# Patient Record
Sex: Female | Born: 1970
Health system: Southern US, Community
[De-identification: ages and names within clinical notes are randomized; demographics above are authoritative.]

## PROBLEM LIST (undated history)

## (undated) DIAGNOSIS — D429 Neoplasm of uncertain behavior of meninges, unspecified: Secondary | ICD-10-CM

## (undated) DIAGNOSIS — D332 Benign neoplasm of brain, unspecified: Secondary | ICD-10-CM

## (undated) DIAGNOSIS — IMO0001 Reserved for inherently not codable concepts without codable children: Secondary | ICD-10-CM

## (undated) DIAGNOSIS — E785 Hyperlipidemia, unspecified: Secondary | ICD-10-CM

## (undated) DIAGNOSIS — R42 Dizziness and giddiness: Secondary | ICD-10-CM

## (undated) DIAGNOSIS — IMO0002 Reserved for concepts with insufficient information to code with codable children: Secondary | ICD-10-CM

## (undated) HISTORY — DX: Hyperlipidemia, unspecified: E78.5

## (undated) HISTORY — PX: HALO APPLICATION: SHX1720

## (undated) HISTORY — DX: Neoplasm of uncertain behavior of meninges, unspecified: D42.9

## (undated) HISTORY — PX: MYRINGOTOMY WITH TUBE PLACEMENT: SHX5663

---

## 2000-08-27 ENCOUNTER — Other Ambulatory Visit: Admission: RE | Admit: 2000-08-27 | Discharge: 2000-08-27 | Payer: Self-pay

## 2001-05-13 ENCOUNTER — Other Ambulatory Visit: Admission: RE | Admit: 2001-05-13 | Discharge: 2001-05-13 | Payer: Self-pay

## 2004-03-29 ENCOUNTER — Ambulatory Visit (HOSPITAL_COMMUNITY): Admission: RE | Admit: 2004-03-29 | Discharge: 2004-03-29 | Payer: Self-pay | Admitting: Family Medicine

## 2005-03-26 ENCOUNTER — Ambulatory Visit: Payer: Self-pay | Admitting: Cardiology

## 2005-04-02 ENCOUNTER — Ambulatory Visit: Payer: Self-pay | Admitting: Cardiology

## 2009-12-05 HISTORY — PX: BRAIN BIOPSY: SHX905

## 2011-02-26 ENCOUNTER — Other Ambulatory Visit (HOSPITAL_COMMUNITY): Payer: Self-pay | Admitting: Family Medicine

## 2011-02-26 DIAGNOSIS — Z139 Encounter for screening, unspecified: Secondary | ICD-10-CM

## 2011-03-01 ENCOUNTER — Ambulatory Visit (HOSPITAL_COMMUNITY)
Admission: RE | Admit: 2011-03-01 | Discharge: 2011-03-01 | Disposition: A | Discharge status: Home / Self Care | Payer: Self-pay | Source: Ambulatory Visit | Attending: Family Medicine | Admitting: Family Medicine

## 2011-03-01 DIAGNOSIS — Z139 Encounter for screening, unspecified: Secondary | ICD-10-CM

## 2011-08-06 DIAGNOSIS — D329 Benign neoplasm of meninges, unspecified: Secondary | ICD-10-CM | POA: Insufficient documentation

## 2011-09-06 ENCOUNTER — Ambulatory Visit (INDEPENDENT_AMBULATORY_CARE_PROVIDER_SITE_OTHER): Payer: Medicaid Other | Admitting: Otolaryngology

## 2011-09-06 DIAGNOSIS — H902 Conductive hearing loss, unspecified: Secondary | ICD-10-CM

## 2011-09-06 DIAGNOSIS — C313 Malignant neoplasm of sphenoid sinus: Secondary | ICD-10-CM

## 2011-09-06 DIAGNOSIS — H6120 Impacted cerumen, unspecified ear: Secondary | ICD-10-CM

## 2011-09-06 DIAGNOSIS — H6980 Other specified disorders of Eustachian tube, unspecified ear: Secondary | ICD-10-CM

## 2011-09-13 ENCOUNTER — Ambulatory Visit (INDEPENDENT_AMBULATORY_CARE_PROVIDER_SITE_OTHER): Payer: Medicaid Other | Admitting: Otolaryngology

## 2011-09-13 DIAGNOSIS — H902 Conductive hearing loss, unspecified: Secondary | ICD-10-CM

## 2011-09-13 DIAGNOSIS — H6980 Other specified disorders of Eustachian tube, unspecified ear: Secondary | ICD-10-CM

## 2011-09-13 DIAGNOSIS — H6520 Chronic serous otitis media, unspecified ear: Secondary | ICD-10-CM

## 2011-10-11 ENCOUNTER — Ambulatory Visit (INDEPENDENT_AMBULATORY_CARE_PROVIDER_SITE_OTHER): Payer: Medicaid Other | Admitting: Otolaryngology

## 2011-10-11 DIAGNOSIS — H7200 Central perforation of tympanic membrane, unspecified ear: Secondary | ICD-10-CM

## 2011-10-11 DIAGNOSIS — H6980 Other specified disorders of Eustachian tube, unspecified ear: Secondary | ICD-10-CM

## 2011-10-11 DIAGNOSIS — H6990 Unspecified Eustachian tube disorder, unspecified ear: Secondary | ICD-10-CM

## 2012-04-10 ENCOUNTER — Ambulatory Visit (INDEPENDENT_AMBULATORY_CARE_PROVIDER_SITE_OTHER): Payer: Medicaid Other | Admitting: Otolaryngology

## 2012-04-10 DIAGNOSIS — H6980 Other specified disorders of Eustachian tube, unspecified ear: Secondary | ICD-10-CM

## 2012-04-10 DIAGNOSIS — H7200 Central perforation of tympanic membrane, unspecified ear: Secondary | ICD-10-CM

## 2012-05-19 ENCOUNTER — Other Ambulatory Visit (HOSPITAL_COMMUNITY): Payer: Self-pay | Admitting: Physician Assistant

## 2012-05-19 DIAGNOSIS — Z139 Encounter for screening, unspecified: Secondary | ICD-10-CM

## 2012-06-02 ENCOUNTER — Ambulatory Visit (HOSPITAL_COMMUNITY)
Admission: RE | Admit: 2012-06-02 | Discharge: 2012-06-02 | Disposition: A | Discharge status: Home / Self Care | Payer: Medicare Other | Source: Ambulatory Visit | Attending: Physician Assistant | Admitting: Physician Assistant

## 2012-06-02 DIAGNOSIS — Z1231 Encounter for screening mammogram for malignant neoplasm of breast: Secondary | ICD-10-CM | POA: Insufficient documentation

## 2012-06-02 DIAGNOSIS — Z139 Encounter for screening, unspecified: Secondary | ICD-10-CM

## 2012-07-08 NOTE — H&P (Signed)
  NTS SOAP Note  Vital Signs:  Vitals as of: 07/08/2012: Systolic 108: Diastolic 64: Heart Rate 85: Temp 98.22F: Height 36ft 6in: Weight 143Lbs 0 Ounces: Pain Level 7: BMI 23.08  BMI : 23.08 kg/m2  Subjective: This 42 Years 42 Months old Female presents for a tender nodule on her elbow.  Has been present for some time, but is increasing in size and tender.   Review of Symptoms:  Constitutional:  fatigue    headache Eyes:  blurred vision bilateral Cardiovascular:  unremarkable   Respiratory:  dyspnea,cough Gastrointestin    nausea,heartburn Genitourinary:unremarkable       joint and back pain Hematolgic/Lymphatic:unremarkable     Allergic/Immunologic:unremarkable     Past Medical History:    Reviewed   Past Medical History  Surgical History: brain biopsy Allergies: nkda Medications: hydrocodone, meloxicam   Social History:Reviewed  Social History  Preferred Language: English Race:  Black or African American Ethnicity: Not Hispanic / Latino Age: 42 Years 0 Months Marital Status:  S Alcohol:  No Recreational drug(s):  No   Smoking Status: Never smoker reviewed on 07/08/2012 Functional Status reviewed on mm/dd/yyyy ------------------------------------------------ Bathing: Normal Cooking: Normal Dressing: Normal Driving: Normal Eating: Normal Managing Meds: Normal Oral Care: Normal Shopping: Normal Toileting: Normal Transferring: Normal Walking: Normal Cognitive Status reviewed on mm/dd/yyyy ------------------------------------------------ Attention: Normal Decision Making: Normal Language: Normal Memory: Normal Motor: Normal Perception: Normal Problem Solving: Normal Visual and Spatial: Normal   Family History:  Reviewed  Family Health History Family History is Unknown    Objective Information: General:  Well appearing, well nourished in no distress. Heart:  RRR, no murmur Lungs:    CTA  bilaterally, no wheezes, rhonchi, rales.  Breathing unlabored.   2cm raised mobile skin nodule over elbow.  Tender to touoh.  Assessment:skin mass, left elbow  Diagnosis &amp; Procedure Smart Code   Plan:Scheduled for excision of skin neoplasm, left elbow on 07/23/12.   Patient Education:Alternative treatments to surgery were discussed with patient (and family).  Risks and benefits  of procedure were fully explained to the patient (and family) who gave informed consent. Patient/family questions were addressed.  Follow-up:Pending Surgery

## 2012-07-15 ENCOUNTER — Encounter (HOSPITAL_COMMUNITY): Payer: Self-pay | Admitting: Pharmacy Technician

## 2012-07-15 NOTE — Patient Instructions (Addendum)
    Sheena Willis  07/15/2012   Your procedure is scheduled on:  07/23/2012  Report to [facility] at  700  AM.  Call this number if you have problems the morning of surgery: [phone]   Remember:   Do not eat food or drink liquids after midnight.   Take these medicines the morning of surgery with A SIP OF WATER: none   Do not wear jewelry, make-up or nail polish.  Do not wear lotions, powders, or perfumes.   Do not shave 48 hours prior to surgery. Men may shave face and neck.  Do not bring valuables to the hospital.  [facility] is not responsible for any belongings or valuables.  Contacts, dentures or bridgework may not be worn into surgery.  Leave suitcase in the car. After surgery it may be brought to your room.  For patients admitted to the hospital, checkout time is 11:00 AM the day of discharge.   Patients discharged the day of surgery will not be allowed to drive home.  Name and phone number of your driver: family  Special Instructions: Shower using CHG 2 nights before surgery and the night before surgery.  If you shower the day of surgery use CHG.  Use special wash - you have one bottle of CHG for all showers.  You should use approximately 1/3 of the bottle for each shower.   Please read over the following fact sheets that you were given: Pain Booklet, Coughing and Deep Breathing, Surgical Site Infection Prevention, Anesthesia Post-op Instructions and Care and Recovery After Surgery PATIENT INSTRUCTIONS POST-ANESTHESIA  IMMEDIATELY FOLLOWING SURGERY:  Do not drive or operate machinery for the first twenty four hours after surgery.  Do not make any important decisions for twenty four hours after surgery or while taking narcotic pain medications or sedatives.  If you develop intractable nausea and vomiting or a severe headache please notify your doctor immediately.  FOLLOW-UP:  Please make an appointment with your surgeon as instructed. You do not need to follow up with anesthesia  unless specifically instructed to do so.  WOUND CARE INSTRUCTIONS (if applicable):  Keep a dry clean dressing on the anesthesia/puncture wound site if there is drainage.  Once the wound has quit draining you may leave it open to air.  Generally you should leave the bandage intact for twenty four hours unless there is drainage.  If the epidural site drains for more than 36-48 hours please call the anesthesia department.  QUESTIONS?:  Please feel free to call your physician or the hospital operator if you have any questions, and they will be happy to assist you.

## 2012-07-16 ENCOUNTER — Encounter (HOSPITAL_COMMUNITY)
Admission: RE | Admit: 2012-07-16 | Discharge: 2012-07-16 | Disposition: A | Discharge status: Home / Self Care | Payer: Medicare Other | Source: Ambulatory Visit | Attending: General Surgery | Admitting: General Surgery

## 2012-07-16 ENCOUNTER — Encounter (HOSPITAL_COMMUNITY): Payer: Self-pay

## 2012-07-16 HISTORY — DX: Dizziness and giddiness: R42

## 2012-07-16 HISTORY — DX: Benign neoplasm of brain, unspecified: D33.2

## 2012-07-16 HISTORY — DX: Reserved for inherently not codable concepts without codable children: IMO0001

## 2012-07-16 HISTORY — DX: Reserved for concepts with insufficient information to code with codable children: IMO0002

## 2012-07-16 LAB — CBC WITH DIFFERENTIAL/PLATELET
Basophils Relative: 0 % (ref 0–1)
HCT: 42.6 % (ref 36.0–46.0)
MCH: 29.4 pg (ref 26.0–34.0)
MCHC: 34 g/dL (ref 30.0–36.0)
Neutro Abs: 4.4 10*3/uL (ref 1.7–7.7)
Neutrophils Relative %: 62 % (ref 43–77)
RDW: 13.8 % (ref 11.5–15.5)

## 2012-07-16 LAB — BASIC METABOLIC PANEL
BUN: 12 mg/dL (ref 6–23)
Calcium: 10.1 mg/dL (ref 8.4–10.5)
Chloride: 102 mEq/L (ref 96–112)
GFR calc Af Amer: 77 mL/min — ABNORMAL LOW (ref >90)
GFR calc non Af Amer: 67 mL/min — ABNORMAL LOW (ref >90)
Potassium: 4.1 mEq/L (ref 3.5–5.1)

## 2012-07-16 MED ORDER — CHLORHEXIDINE GLUCONATE 4 % EX LIQD: 1.0000 "application " | Freq: Once | CUTANEOUS | Status: DC

## 2012-07-18 ENCOUNTER — Other Ambulatory Visit (HOSPITAL_COMMUNITY): Payer: Medicare Other

## 2012-07-23 ENCOUNTER — Encounter (HOSPITAL_COMMUNITY): Payer: Self-pay | Admitting: *Deleted

## 2012-07-23 ENCOUNTER — Ambulatory Visit (HOSPITAL_COMMUNITY): Payer: Medicare Other | Admitting: Anesthesiology

## 2012-07-23 ENCOUNTER — Encounter (HOSPITAL_COMMUNITY)
Admission: RE | Disposition: A | Discharge status: Home / Self Care | Payer: Self-pay | Source: Ambulatory Visit | Attending: General Surgery

## 2012-07-23 ENCOUNTER — Encounter (HOSPITAL_COMMUNITY): Payer: Self-pay | Admitting: Anesthesiology

## 2012-07-23 ENCOUNTER — Ambulatory Visit (HOSPITAL_COMMUNITY)
Admission: RE | Admit: 2012-07-23 | Discharge: 2012-07-23 | Disposition: A | Discharge status: Home / Self Care | Payer: Medicare Other | Source: Ambulatory Visit | Attending: General Surgery | Admitting: General Surgery

## 2012-07-23 DIAGNOSIS — D2360 Other benign neoplasm of skin of unspecified upper limb, including shoulder: Secondary | ICD-10-CM | POA: Insufficient documentation

## 2012-07-23 HISTORY — PX: LESION REMOVAL: SHX5196

## 2012-07-23 SURGERY — WIDE EXCISION, LESION, UPPER EXTREMITY
Anesthesia: General | Site: Elbow | Laterality: Left | Wound class: Clean

## 2012-07-23 MED ORDER — ONDANSETRON HCL 4 MG/2ML IJ SOLN
INTRAMUSCULAR | Status: AC
Start: 2012-07-23 — End: 2012-07-23
  Filled 2012-07-23: qty 2

## 2012-07-23 MED ORDER — HYDROCODONE-ACETAMINOPHEN 5-325 MG PO TABS: 1.0000 | ORAL_TABLET | Freq: Four times a day (QID) | ORAL | Status: DC | PRN

## 2012-07-23 MED ORDER — KETOROLAC TROMETHAMINE 30 MG/ML IJ SOLN: 30.0000 mg | Freq: Once | INTRAMUSCULAR | Status: DC

## 2012-07-23 MED ORDER — SUCCINYLCHOLINE CHLORIDE 20 MG/ML IJ SOLN
INTRAMUSCULAR | Status: AC
  Filled 2012-07-23: qty 1

## 2012-07-23 MED ORDER — LACTATED RINGERS IV SOLN: INTRAVENOUS | Status: DC

## 2012-07-23 MED ORDER — PROPOFOL 10 MG/ML IV EMUL
INTRAVENOUS | Status: AC
  Filled 2012-07-23: qty 20

## 2012-07-23 MED ORDER — MIDAZOLAM HCL 2 MG/2ML IJ SOLN
1.0000 mg | INTRAMUSCULAR | Status: DC | PRN
  Administered 2012-07-23: 2 mg via INTRAVENOUS

## 2012-07-23 MED ORDER — ONDANSETRON HCL 4 MG/2ML IJ SOLN
4.0000 mg | Freq: Once | INTRAMUSCULAR | Status: AC
  Administered 2012-07-23: 4 mg via INTRAVENOUS

## 2012-07-23 MED ORDER — FENTANYL CITRATE 0.05 MG/ML IJ SOLN: 25.0000 ug | INTRAMUSCULAR | Status: DC | PRN

## 2012-07-23 MED ORDER — LIDOCAINE HCL (PF) 1 % IJ SOLN
INTRAMUSCULAR | Status: AC
  Filled 2012-07-23: qty 5

## 2012-07-23 MED ORDER — BUPIVACAINE HCL (PF) 0.5 % IJ SOLN
INTRAMUSCULAR | Status: DC | PRN
  Administered 2012-07-23: 1 mL

## 2012-07-23 MED ORDER — SODIUM CHLORIDE 0.9 % IR SOLN
Status: DC | PRN
  Administered 2012-07-23: 500 mL

## 2012-07-23 MED ORDER — FENTANYL CITRATE 0.05 MG/ML IJ SOLN
25.0000 ug | INTRAMUSCULAR | Status: AC
  Administered 2012-07-23: 25 ug via INTRAVENOUS

## 2012-07-23 MED ORDER — FENTANYL CITRATE 0.05 MG/ML IJ SOLN
INTRAMUSCULAR | Status: AC
  Filled 2012-07-23: qty 2

## 2012-07-23 MED ORDER — LIDOCAINE HCL (CARDIAC) 20 MG/ML IV SOLN
INTRAVENOUS | Status: DC | PRN
  Administered 2012-07-23: 50 mg via INTRAVENOUS

## 2012-07-23 MED ORDER — FENTANYL CITRATE 0.05 MG/ML IJ SOLN
INTRAMUSCULAR | Status: DC | PRN
  Administered 2012-07-23: 25 ug via INTRAVENOUS
  Administered 2012-07-23: 50 ug via INTRAVENOUS
  Administered 2012-07-23: 25 ug via INTRAVENOUS

## 2012-07-23 MED ORDER — BACITRACIN ZINC 500 UNIT/GM EX OINT
TOPICAL_OINTMENT | CUTANEOUS | Status: DC | PRN
  Administered 2012-07-23: 1 via TOPICAL

## 2012-07-23 MED ORDER — PROPOFOL 10 MG/ML IV BOLUS
INTRAVENOUS | Status: DC | PRN
  Administered 2012-07-23: 150 mg via INTRAVENOUS

## 2012-07-23 MED ORDER — LACTATED RINGERS IV SOLN
INTRAVENOUS | Status: DC | PRN
  Administered 2012-07-23: 07:00:00 via INTRAVENOUS

## 2012-07-23 MED ORDER — MIDAZOLAM HCL 2 MG/2ML IJ SOLN
INTRAMUSCULAR | Status: AC
  Filled 2012-07-23: qty 2

## 2012-07-23 MED ORDER — ONDANSETRON HCL 4 MG/2ML IJ SOLN: 4.0000 mg | Freq: Once | INTRAMUSCULAR | Status: DC | PRN

## 2012-07-23 SURGICAL SUPPLY — 36 items
APL SKNCLS STERI-STRIP NONHPOA (GAUZE/BANDAGES/DRESSINGS)
BAG HAMPER (MISCELLANEOUS) ×2 IMPLANT
BENZOIN TINCTURE PRP APPL 2/3 (GAUZE/BANDAGES/DRESSINGS) ×1 IMPLANT
CLOTH BEACON ORANGE TIMEOUT ST (SAFETY) ×2 IMPLANT
COVER LIGHT HANDLE STERIS (MISCELLANEOUS) ×4 IMPLANT
DRSG TEGADERM 4X4.75 (GAUZE/BANDAGES/DRESSINGS) ×1 IMPLANT
DURAPREP 26ML APPLICATOR (WOUND CARE) ×2 IMPLANT
ELECT REM PT RETURN 9FT ADLT (ELECTROSURGICAL) ×2
ELECTRODE REM PT RTRN 9FT ADLT (ELECTROSURGICAL) ×1 IMPLANT
FORMALIN 10 PREFIL 120ML (MISCELLANEOUS) ×1 IMPLANT
GLOVE BIO SURGEON STRL SZ7.5 (GLOVE) ×2 IMPLANT
GLOVE INDICATOR 7.0 STRL GRN (GLOVE) ×1 IMPLANT
GLOVE SKINSENSE NS SZ6.5 (GLOVE) ×1
GLOVE SKINSENSE STRL SZ6.5 (GLOVE) IMPLANT
GOWN STRL REIN XL XLG (GOWN DISPOSABLE) ×4 IMPLANT
KIT ROOM TURNOVER APOR (KITS) ×2 IMPLANT
MANIFOLD NEPTUNE II (INSTRUMENTS) ×2 IMPLANT
NDL HYPO 18GX1.5 BLUNT FILL (NEEDLE) IMPLANT
NDL HYPO 25X1 1.5 SAFETY (NEEDLE) ×1 IMPLANT
NEEDLE HYPO 18GX1.5 BLUNT FILL (NEEDLE) IMPLANT
NEEDLE HYPO 25X1 1.5 SAFETY (NEEDLE) ×2 IMPLANT
NS IRRIG 1000ML POUR BTL (IV SOLUTION) ×2 IMPLANT
PACK BASIC LIMB (CUSTOM PROCEDURE TRAY) ×1 IMPLANT
PACK MINOR (CUSTOM PROCEDURE TRAY) ×1 IMPLANT
PACK PERI GYN (CUSTOM PROCEDURE TRAY) IMPLANT
PAD ARMBOARD 7.5X6 YLW CONV (MISCELLANEOUS) ×2 IMPLANT
SET BASIN LINEN APH (SET/KITS/TRAYS/PACK) ×2 IMPLANT
SOL PREP PROV IODINE SCRUB 4OZ (MISCELLANEOUS) IMPLANT
SPONGE GAUZE 2X2 8PLY STRL LF (GAUZE/BANDAGES/DRESSINGS) ×1 IMPLANT
STRIP CLOSURE SKIN 1/2X4 (GAUZE/BANDAGES/DRESSINGS) ×1 IMPLANT
SUT PROLENE 3 0 PS 1 (SUTURE) ×1 IMPLANT
SUT VIC AB 3-0 SH 27 (SUTURE)
SUT VIC AB 3-0 SH 27X BRD (SUTURE) IMPLANT
SUT VIC AB 4-0 PS2 27 (SUTURE) ×1 IMPLANT
SYR BULB IRRIGATION 50ML (SYRINGE) ×1 IMPLANT
SYR CONTROL 10ML LL (SYRINGE) ×2 IMPLANT

## 2012-07-23 NOTE — Anesthesia Preprocedure Evaluation (Addendum)
Anesthesia Evaluation  Patient identified by MRN, date of birth, ID band Patient awake    Reviewed: Allergy & Precautions, H&P , NPO status , Patient's Chart, lab work & pertinent test results  History of Anesthesia Complications Negative for: history of anesthetic complications  Airway Mallampati: II TM Distance: >3 FB     Dental  (+) Teeth Intact   Pulmonary neg pulmonary ROS,  breath sounds clear to auscultation        Cardiovascular negative cardio ROS  Rhythm:Regular Rate:Normal     Neuro/Psych    GI/Hepatic   Endo/Other    Renal/GU      Musculoskeletal   Abdominal   Peds  Hematology   Anesthesia Other Findings   Reproductive/Obstetrics                          Anesthesia Physical Anesthesia Plan  ASA: II  Anesthesia Plan: General   Post-op Pain Management:    Induction: Intravenous  Airway Management Planned: LMA  Additional Equipment:   Intra-op Plan:   Post-operative Plan: Extubation in OR  Informed Consent: I have reviewed the patients History and Physical, chart, labs and discussed the procedure including the risks, benefits and alternatives for the proposed anesthesia with the patient or authorized representative who has indicated his/her understanding and acceptance.     Plan Discussed with:   Anesthesia Plan Comments:         Anesthesia Quick Evaluation

## 2012-07-23 NOTE — Addendum Note (Signed)
Addendum created 07/23/12 0911 by Marolyn Hammock, CRNA   Modules edited: Anesthesia Medication Administration

## 2012-07-23 NOTE — Interval H&P Note (Signed)
History and Physical Interval Note:  07/23/2012 8:29 AM  Sheena Willis  has presented today for surgery, with the diagnosis of skin neoplasm left elbow  The various methods of treatment have been discussed with the patient and family. After consideration of risks, benefits and other options for treatment, the patient has consented to  Procedure(s): EXCISION SKIN NEOPLASM LEFT ELBOW (Left) as a surgical intervention .  The patient's history has been reviewed, patient examined, no change in status, stable for surgery.  I have reviewed the patient's chart and labs.  Questions were answered to the patient's satisfaction.     Franky Macho A

## 2012-07-23 NOTE — Anesthesia Postprocedure Evaluation (Signed)
  Anesthesia Post-op Note  Patient: Sheena Willis  Procedure(s) Performed: Procedure(s): EXCISION SKIN NEOPLASM LEFT ELBOW (Left)  Patient Location: PACU  Anesthesia Type:General  Level of Consciousness: awake, alert , oriented and patient cooperative  Airway and Oxygen Therapy: Patient Spontanous Breathing  Post-op Pain: none  Post-op Assessment: Post-op Vital signs reviewed and Patient's Cardiovascular Status Stable  Post-op Vital Signs: Reviewed and stable  Complications: No apparent anesthesia complications

## 2012-07-23 NOTE — Preoperative (Signed)
Beta Blockers   Reason not to administer Beta Blockers:Not Applicable 

## 2012-07-23 NOTE — Transfer of Care (Signed)
Immediate Anesthesia Transfer of Care Note  Patient: Sheena Willis  Procedure(s) Performed: Procedure(s): EXCISION SKIN NEOPLASM LEFT ELBOW (Left)  Patient Location: PACU  Anesthesia Type:General  Level of Consciousness: awake, alert , oriented and patient cooperative  Airway & Oxygen Therapy: Patient Spontanous Breathing  Post-op Assessment: Report given to PACU RN and Post -op Vital signs reviewed and stable  Post vital signs: Reviewed and stable  Complications: No apparent anesthesia complications

## 2012-07-23 NOTE — Op Note (Signed)
Patient:  Sheena Willis  DOB:  01/11/1970  MRN:  [id number]   Preop Diagnosis:  Skin neoplasm, left elbow  Postop Diagnosis:  Same, granuloma  Procedure:  Excision of neoplasm, left elbow  Surgeon:  Franky Macho, M.D.  Anes:  Gen.  Indications:  Patient is a 42 year old black female presents with a tender subcutaneous nodule in the left elbow region. Risks and benefits of the procedure were fully explained to the patient, who gave informed consent.  Procedure note:  The patient was placed in the supine position. After general anesthesia was administered, the left elbow was prepped and draped using the usual sterile technique with DuraPrep. Surgical site which was performed.  Elliptical incision was made over the mass which was approximately 1.5 cm in its greatest diameter. Dissection was taken down to the subcutaneous tissue. The mass was removed without difficulty. Appear to be a granuloma. He was sent to pathology for examination. It was not attached any underlining tendon or muscle. The wound was injected with 0.5% Sensorcaine. 4-0 nylon interrupted sutures were then placed. Betadine ointment and pressure dressings were applied.  All tape in needle counts were correct at the end of the procedure. The patient was awakened and transferred to PACU in stable condition.  Complications:  None  EBL:  Minimal  Specimen:  Left arm granuloma

## 2012-07-23 NOTE — Anesthesia Procedure Notes (Signed)
Procedure Name: LMA Insertion Date/Time: 07/23/2012 8:38 AM Performed by: Carolyne Littles, AMY L Pre-anesthesia Checklist: Patient identified, Timeout performed, Emergency Drugs available, Suction available and Patient being monitored Patient Re-evaluated:Patient Re-evaluated prior to inductionOxygen Delivery Method: Circle system utilized Preoxygenation: Pre-oxygenation with 100% oxygen Intubation Type: IV induction Ventilation: Mask ventilation without difficulty LMA Size: 4.0 Number of attempts: 1 Placement Confirmation: positive ETCO2 and breath sounds checked- equal and bilateral Tube secured with: Tape Dental Injury: Teeth and Oropharynx as per pre-operative assessment

## 2012-07-24 ENCOUNTER — Encounter (HOSPITAL_COMMUNITY): Payer: Self-pay | Admitting: General Surgery

## 2012-10-09 ENCOUNTER — Ambulatory Visit (INDEPENDENT_AMBULATORY_CARE_PROVIDER_SITE_OTHER): Payer: Medicare Other | Admitting: Otolaryngology

## 2012-10-09 ENCOUNTER — Encounter (INDEPENDENT_AMBULATORY_CARE_PROVIDER_SITE_OTHER): Payer: Self-pay

## 2012-10-09 DIAGNOSIS — H7200 Central perforation of tympanic membrane, unspecified ear: Secondary | ICD-10-CM

## 2012-10-09 DIAGNOSIS — H6980 Other specified disorders of Eustachian tube, unspecified ear: Secondary | ICD-10-CM

## 2013-04-09 ENCOUNTER — Ambulatory Visit (INDEPENDENT_AMBULATORY_CARE_PROVIDER_SITE_OTHER): Payer: Medicare Other | Admitting: Otolaryngology

## 2013-04-09 DIAGNOSIS — H7200 Central perforation of tympanic membrane, unspecified ear: Secondary | ICD-10-CM | POA: Diagnosis not present

## 2013-04-09 DIAGNOSIS — H6980 Other specified disorders of Eustachian tube, unspecified ear: Secondary | ICD-10-CM

## 2013-08-04 ENCOUNTER — Other Ambulatory Visit (HOSPITAL_COMMUNITY): Payer: Self-pay | Admitting: Physician Assistant

## 2013-08-04 DIAGNOSIS — Z1231 Encounter for screening mammogram for malignant neoplasm of breast: Secondary | ICD-10-CM

## 2013-08-04 DIAGNOSIS — Z139 Encounter for screening, unspecified: Secondary | ICD-10-CM

## 2013-08-12 ENCOUNTER — Ambulatory Visit (HOSPITAL_COMMUNITY)
Admission: RE | Admit: 2013-08-12 | Discharge: 2013-08-12 | Disposition: A | Discharge status: Home / Self Care | Payer: Medicare Other | Source: Ambulatory Visit | Attending: Physician Assistant | Admitting: Physician Assistant

## 2013-08-12 DIAGNOSIS — Z1231 Encounter for screening mammogram for malignant neoplasm of breast: Secondary | ICD-10-CM | POA: Insufficient documentation

## 2013-10-08 ENCOUNTER — Ambulatory Visit (INDEPENDENT_AMBULATORY_CARE_PROVIDER_SITE_OTHER): Payer: Medicare Other | Admitting: Otolaryngology

## 2013-10-22 ENCOUNTER — Ambulatory Visit (INDEPENDENT_AMBULATORY_CARE_PROVIDER_SITE_OTHER): Payer: Medicare Other | Admitting: Otolaryngology

## 2013-10-22 DIAGNOSIS — H9041 Sensorineural hearing loss, unilateral, right ear, with unrestricted hearing on the contralateral side: Secondary | ICD-10-CM | POA: Diagnosis not present

## 2013-10-22 DIAGNOSIS — H6983 Other specified disorders of Eustachian tube, bilateral: Secondary | ICD-10-CM | POA: Diagnosis not present

## 2013-10-22 DIAGNOSIS — H9042 Sensorineural hearing loss, unilateral, left ear, with unrestricted hearing on the contralateral side: Secondary | ICD-10-CM

## 2014-04-22 ENCOUNTER — Ambulatory Visit (INDEPENDENT_AMBULATORY_CARE_PROVIDER_SITE_OTHER): Payer: Medicare Other | Admitting: Otolaryngology

## 2014-08-09 ENCOUNTER — Other Ambulatory Visit (HOSPITAL_COMMUNITY): Payer: Self-pay | Admitting: Physician Assistant

## 2014-08-09 DIAGNOSIS — Z1231 Encounter for screening mammogram for malignant neoplasm of breast: Secondary | ICD-10-CM

## 2014-08-23 ENCOUNTER — Ambulatory Visit (HOSPITAL_COMMUNITY)
Admission: RE | Admit: 2014-08-23 | Discharge: 2014-08-23 | Disposition: A | Discharge status: Home / Self Care | Payer: Medicare Other | Source: Ambulatory Visit | Attending: Physician Assistant | Admitting: Physician Assistant

## 2014-08-23 DIAGNOSIS — Z1231 Encounter for screening mammogram for malignant neoplasm of breast: Secondary | ICD-10-CM | POA: Diagnosis not present

## 2014-09-09 ENCOUNTER — Ambulatory Visit (INDEPENDENT_AMBULATORY_CARE_PROVIDER_SITE_OTHER): Payer: Medicare Other | Admitting: Otolaryngology

## 2014-09-09 DIAGNOSIS — H6122 Impacted cerumen, left ear: Secondary | ICD-10-CM | POA: Diagnosis not present

## 2014-09-09 DIAGNOSIS — H6522 Chronic serous otitis media, left ear: Secondary | ICD-10-CM | POA: Diagnosis not present

## 2014-09-09 DIAGNOSIS — H6982 Other specified disorders of Eustachian tube, left ear: Secondary | ICD-10-CM

## 2015-03-31 DIAGNOSIS — H52223 Regular astigmatism, bilateral: Secondary | ICD-10-CM | POA: Diagnosis not present

## 2015-03-31 DIAGNOSIS — H5203 Hypermetropia, bilateral: Secondary | ICD-10-CM | POA: Diagnosis not present

## 2015-03-31 DIAGNOSIS — H04123 Dry eye syndrome of bilateral lacrimal glands: Secondary | ICD-10-CM | POA: Diagnosis not present

## 2015-03-31 DIAGNOSIS — H4710 Unspecified papilledema: Secondary | ICD-10-CM | POA: Diagnosis not present

## 2015-08-18 ENCOUNTER — Other Ambulatory Visit (HOSPITAL_COMMUNITY): Payer: Self-pay | Admitting: *Deleted

## 2015-08-18 DIAGNOSIS — Z1231 Encounter for screening mammogram for malignant neoplasm of breast: Secondary | ICD-10-CM

## 2015-09-01 ENCOUNTER — Ambulatory Visit (HOSPITAL_COMMUNITY)
Admission: RE | Admit: 2015-09-01 | Discharge: 2015-09-01 | Disposition: A | Discharge status: Home / Self Care | Payer: Medicare Other | Source: Ambulatory Visit | Attending: *Deleted | Admitting: *Deleted

## 2015-09-01 DIAGNOSIS — Z1231 Encounter for screening mammogram for malignant neoplasm of breast: Secondary | ICD-10-CM | POA: Diagnosis not present

## 2015-10-17 DIAGNOSIS — Z01419 Encounter for gynecological examination (general) (routine) without abnormal findings: Secondary | ICD-10-CM | POA: Diagnosis not present

## 2015-10-17 DIAGNOSIS — E7800 Pure hypercholesterolemia, unspecified: Secondary | ICD-10-CM | POA: Diagnosis not present

## 2015-10-17 DIAGNOSIS — Z309 Encounter for contraceptive management, unspecified: Secondary | ICD-10-CM | POA: Diagnosis not present

## 2015-10-17 DIAGNOSIS — M2550 Pain in unspecified joint: Secondary | ICD-10-CM | POA: Diagnosis not present

## 2015-10-17 DIAGNOSIS — D332 Benign neoplasm of brain, unspecified: Secondary | ICD-10-CM | POA: Diagnosis not present

## 2015-10-17 DIAGNOSIS — F341 Dysthymic disorder: Secondary | ICD-10-CM | POA: Diagnosis not present

## 2015-10-17 DIAGNOSIS — Z1239 Encounter for other screening for malignant neoplasm of breast: Secondary | ICD-10-CM | POA: Diagnosis not present

## 2016-05-04 ENCOUNTER — Encounter (HOSPITAL_COMMUNITY): Payer: Self-pay

## 2016-05-04 ENCOUNTER — Emergency Department (HOSPITAL_COMMUNITY)
Admission: EM | Admit: 2016-05-04 | Discharge: 2016-05-04 | Disposition: A | Discharge status: Home / Self Care | Payer: Medicare Other | Attending: Emergency Medicine | Admitting: Emergency Medicine

## 2016-05-04 DIAGNOSIS — M79601 Pain in right arm: Secondary | ICD-10-CM | POA: Diagnosis not present

## 2016-05-04 LAB — BASIC METABOLIC PANEL
Anion gap: 12 (ref 5–15)
BUN: 13 mg/dL (ref 6–20)
CHLORIDE: 105 mmol/L (ref 101–111)
CO2: 19 mmol/L — AB (ref 22–32)
Calcium: 10.3 mg/dL (ref 8.9–10.3)
Creatinine, Ser: 0.81 mg/dL (ref 0.44–1.00)
GFR calc Af Amer: >60 mL/min (ref >60)
GFR calc non Af Amer: >60 mL/min (ref >60)
Glucose, Bld: 115 mg/dL — ABNORMAL HIGH (ref 65–99)
POTASSIUM: 3.3 mmol/L — AB (ref 3.5–5.1)
SODIUM: 136 mmol/L (ref 135–145)

## 2016-05-04 LAB — CBC WITH DIFFERENTIAL/PLATELET
Basophils Absolute: 0.1 10*3/uL (ref 0.0–0.1)
Basophils Relative: 0 %
EOS ABS: 0.1 10*3/uL (ref 0.0–0.7)
Eosinophils Relative: 1 %
HEMATOCRIT: 40.4 % (ref 36.0–46.0)
HEMOGLOBIN: 13.9 g/dL (ref 12.0–15.0)
LYMPHS ABS: 2.9 10*3/uL (ref 0.7–4.0)
LYMPHS PCT: 18 %
MCH: 29 pg (ref 26.0–34.0)
MCHC: 34.4 g/dL (ref 30.0–36.0)
MCV: 84.2 fL (ref 78.0–100.0)
MONOS PCT: 4 %
Monocytes Absolute: 0.6 10*3/uL (ref 0.1–1.0)
NEUTROS ABS: 12.5 10*3/uL — AB (ref 1.7–7.7)
NEUTROS PCT: 77 %
Platelets: 266 10*3/uL (ref 150–400)
RBC: 4.8 MIL/uL (ref 3.87–5.11)
RDW: 14.2 % (ref 11.5–15.5)
WBC: 16.1 10*3/uL — AB (ref 4.0–10.5)

## 2016-05-04 MED ORDER — LIDOCAINE 5 % EX PTCH
1.0000 | MEDICATED_PATCH | CUTANEOUS | Status: DC
  Administered 2016-05-04: 1 via TRANSDERMAL
  Filled 2016-05-04: qty 1

## 2016-05-04 MED ORDER — LIDOCAINE 5 % EX PTCH
MEDICATED_PATCH | CUTANEOUS | Status: AC
  Filled 2016-05-04: qty 1

## 2016-05-04 MED ORDER — ONDANSETRON HCL 4 MG/2ML IJ SOLN
4.0000 mg | Freq: Once | INTRAMUSCULAR | Status: AC
  Administered 2016-05-04: 4 mg via INTRAVENOUS
  Filled 2016-05-04: qty 2

## 2016-05-04 MED ORDER — OXYCODONE-ACETAMINOPHEN 5-325 MG PO TABS: 1.0000 | ORAL_TABLET | ORAL | 0 refills | Status: DC | PRN

## 2016-05-04 MED ORDER — IBUPROFEN 800 MG PO TABS: 800.0000 mg | ORAL_TABLET | Freq: Three times a day (TID) | ORAL | 0 refills | Status: DC

## 2016-05-04 MED ORDER — HYDROMORPHONE HCL 1 MG/ML IJ SOLN
1.0000 mg | Freq: Once | INTRAMUSCULAR | Status: AC
  Administered 2016-05-04: 1 mg via INTRAVENOUS
  Filled 2016-05-04: qty 1

## 2016-05-04 NOTE — ED Notes (Signed)
Pt ambulatory to waiting room. Pt verbalized understanding of discharge instructions.   

## 2016-05-04 NOTE — ED Provider Notes (Signed)
[facility] DEPT Provider Note   CSN: [id number] Arrival date & time: 05/04/16  0215     History   Chief Complaint Chief Complaint  Patient presents with  . Arm Pain    HPI Sheena Willis is a 46 y.o. female.  Patient presents to the emergency department for evaluation of right arm pain. Patient reports that she had a Depo-Provera shot in the right arm earlier today. She started having pain after the shot. She reports the pain is progressively worsening, now severe. She reports severe pain in the deltoid area where the shot was given but pain all over down the arm and up into the shoulder. She reports that it is like "contractions".      Past Medical History:  Diagnosis Date  . Brain tumor (benign) ([place])   . Radiation    for brain tumors  . Vertigo    due to benign brain tumor    There are no active problems to display for this patient.   Past Surgical History:  Procedure Laterality Date  . BRAIN BIOPSY  12/05/2009  . HALO APPLICATION     done before gamma knife radio surgery  . LESION REMOVAL Left 07/23/2012   Procedure: EXCISION SKIN NEOPLASM LEFT ELBOW;  Surgeon: Jamesetta So, MD;  Location: [facility];  Service: General;  Laterality: Left;  . MYRINGOTOMY WITH TUBE PLACEMENT Left    Dr. Benjamine Mola    OB History    No data available       Home Medications    Prior to Admission medications   Medication Sig Start Date End Date Taking? Authorizing Provider  ALPRAZolam Duanne Moron) 0.5 MG tablet Take 0.5 mg by mouth at bedtime as needed for anxiety.   Yes Historical Provider, MD  citalopram (CELEXA) 20 MG tablet Take 20 mg by mouth daily.   Yes Historical Provider, MD  HYDROcodone-acetaminophen (NORCO/VICODIN) 5-325 MG per tablet Take 1-2 tablets by mouth every 6 (six) hours as needed for pain. 07/23/12  Yes Aviva Signs, MD  Multiple Vitamins-Minerals (MULTIVITAMINS THER. W/MINERALS) TABS Take 1 tablet by mouth daily.   Yes Historical Provider, MD  pravastatin  (PRAVACHOL) 20 MG tablet Take 20 mg by mouth daily.   Yes Historical Provider, MD  pseudoephedrine-guaifenesin (MUCINEX D) 60-600 MG 12 hr tablet Take 1 tablet by mouth every 12 (twelve) hours.   Yes Historical Provider, MD  ibuprofen (ADVIL,MOTRIN) 800 MG tablet Take 1 tablet (800 mg total) by mouth 3 (three) times daily. 05/04/16   Orpah Greek, MD  meloxicam (MOBIC) 7.5 MG tablet Take 7.5 mg by mouth daily.    Historical Provider, MD  oxyCODONE-acetaminophen (PERCOCET) 5-325 MG tablet Take 1 tablet by mouth every 4 (four) hours as needed. 05/04/16   Orpah Greek, MD    Family History No family history on file.  Social History Social History  Substance Use Topics  . Smoking status: Never Smoker  . Smokeless tobacco: Never Used  . Alcohol use No     Allergies   Patient has no known allergies.   Review of Systems Review of Systems  Musculoskeletal:       Arm pain  Skin: Negative for color change.  All other systems reviewed and are negative.    Physical Exam Updated Vital Signs BP 135/87 (BP Location: Left Arm)   Pulse 76   Temp 99 F (37.2 C) (Oral)   Resp 20   Ht 5\' 6"  (1.676 m)   Wt 145 lb (65.8 kg)  SpO2 100%   BMI 23.40 kg/m   Physical Exam  Constitutional: She is oriented to person, place, and time. She appears well-developed and well-nourished. No distress.  HENT:  Head: Normocephalic and atraumatic.  Right Ear: Hearing normal.  Left Ear: Hearing normal.  Nose: Nose normal.  Mouth/Throat: Oropharynx is clear and moist and mucous membranes are normal.  Eyes: Conjunctivae and EOM are normal. Pupils are equal, round, and reactive to light.  Neck: Normal range of motion. Neck supple.  Cardiovascular: Regular rhythm, S1 normal and S2 normal.  Exam reveals no gallop and no friction rub.   No murmur heard. Pulses:      Radial pulses are 1+ on the right side.  Pulmonary/Chest: Effort normal and breath sounds normal. No respiratory distress. She  exhibits no tenderness.  Abdominal: Soft. Normal appearance and bowel sounds are normal. There is no hepatosplenomegaly. There is no tenderness. There is no rebound, no guarding, no tenderness at McBurney's point and negative Murphy's sign. No hernia.  Musculoskeletal: Normal range of motion.       Right shoulder: She exhibits tenderness.       Arms: Neurological: She is alert and oriented to person, place, and time. She has normal strength. No cranial nerve deficit or sensory deficit. Coordination normal. GCS eye subscore is 4. GCS verbal subscore is 5. GCS motor subscore is 6.  Patient reluctant to cooperate with exam secondary to pain, but she appears to have a normal flexion extension at the wrist and hand, normal opposition, abduction, abduction of the fingers. Normal sensation, albeit hyperesthesia present  Skin: Skin is warm, dry and intact. No rash noted. No cyanosis.  Skin is normal, no ecchymosis, erythema, warmth, induration noted  Psychiatric: She has a normal mood and affect. Her speech is normal and behavior is normal. Thought content normal.  Nursing note and vitals reviewed.    ED Treatments / Results  Labs (all labs ordered are listed, but only abnormal results are displayed) Labs Reviewed  CBC WITH DIFFERENTIAL/PLATELET - Abnormal; Notable for the following:       Result Value   WBC 16.1 (*)    Neutro Abs 12.5 (*)    All other components within normal limits  BASIC METABOLIC PANEL - Abnormal; Notable for the following:    Potassium 3.3 (*)    CO2 19 (*)    Glucose, Bld 115 (*)    All other components within normal limits    EKG  EKG Interpretation None       Radiology No results found.  Procedures Procedures (including critical care time)  Medications Ordered in ED Medications  lidocaine (LIDODERM) 5 % 1 patch (1 patch Transdermal Patch Applied 05/04/16 0352)  HYDROmorphone (DILAUDID) injection 1 mg (1 mg Intravenous Given 05/04/16 0244)  ondansetron  (ZOFRAN) injection 4 mg (4 mg Intravenous Given 05/04/16 0244)     Initial Impression / Assessment and Plan / ED Course  I have reviewed the triage vital signs and the nursing notes.  Pertinent labs & imaging results that were available during my care of the patient were reviewed by me and considered in my medical decision making (see chart for details).     Patient presents to the ER for evaluation of pain in her right arm. Patient reports the pain began after she received a Depo-Provera injection in the right deltoid region. She has had progressive worsening of the pain. Patient was exhibiting a great deal distress at arrival, suspect some of her distress was  secondary to panic attack.  Any evidence to suggest infection. There is no hematoma or vascular injury. She has normal neurologic function distal to the injection site.  Patient had an IV placed in her other arm and was given analgesia. Lidocaine patch was also placed over the area where the injection was performed. At this point source of the pain is unclear. This might be neuropathic in some way, although there should not be any nerves in the area where the injection was performed. Patient was reassured, ice the area, rest the arm will be provided analgesia.  Final Clinical Impressions(s) / ED Diagnoses   Final diagnoses:  Right arm pain    New Prescriptions New Prescriptions   IBUPROFEN (ADVIL,MOTRIN) 800 MG TABLET    Take 1 tablet (800 mg total) by mouth 3 (three) times daily.   OXYCODONE-ACETAMINOPHEN (PERCOCET) 5-325 MG TABLET    Take 1 tablet by mouth every 4 (four) hours as needed.     Orpah Greek, MD 05/04/16 [phone]

## 2016-05-04 NOTE — ED Triage Notes (Signed)
Pt had a depo shot earlier in the day, states her right arm has been severely painful since then and states she cannot move her arm without the pain increasing

## 2016-05-31 DIAGNOSIS — G894 Chronic pain syndrome: Secondary | ICD-10-CM | POA: Diagnosis not present

## 2016-05-31 DIAGNOSIS — F17210 Nicotine dependence, cigarettes, uncomplicated: Secondary | ICD-10-CM | POA: Diagnosis not present

## 2016-05-31 DIAGNOSIS — F418 Other specified anxiety disorders: Secondary | ICD-10-CM | POA: Diagnosis not present

## 2016-05-31 DIAGNOSIS — E782 Mixed hyperlipidemia: Secondary | ICD-10-CM | POA: Diagnosis not present

## 2016-05-31 DIAGNOSIS — D496 Neoplasm of unspecified behavior of brain: Secondary | ICD-10-CM | POA: Diagnosis not present

## 2016-05-31 DIAGNOSIS — E559 Vitamin D deficiency, unspecified: Secondary | ICD-10-CM | POA: Diagnosis not present

## 2016-05-31 DIAGNOSIS — Z6824 Body mass index (BMI) 24.0-24.9, adult: Secondary | ICD-10-CM | POA: Diagnosis not present

## 2016-07-22 ENCOUNTER — Other Ambulatory Visit: Payer: Self-pay | Admitting: Physician Assistant

## 2016-08-13 ENCOUNTER — Ambulatory Visit (INDEPENDENT_AMBULATORY_CARE_PROVIDER_SITE_OTHER): Payer: Medicare Other | Admitting: Pediatrics

## 2016-08-13 ENCOUNTER — Other Ambulatory Visit: Payer: Self-pay | Admitting: Pediatrics

## 2016-08-13 ENCOUNTER — Encounter: Payer: Self-pay | Admitting: Pediatrics

## 2016-08-13 ENCOUNTER — Encounter (INDEPENDENT_AMBULATORY_CARE_PROVIDER_SITE_OTHER): Payer: Self-pay

## 2016-08-13 VITALS — BP 105/65 | HR 62 | Temp 97.4°F | Ht 66.0 in | Wt 143.0 lb

## 2016-08-13 DIAGNOSIS — D329 Benign neoplasm of meninges, unspecified: Secondary | ICD-10-CM

## 2016-08-13 DIAGNOSIS — N644 Mastodynia: Secondary | ICD-10-CM | POA: Diagnosis not present

## 2016-08-13 DIAGNOSIS — F419 Anxiety disorder, unspecified: Secondary | ICD-10-CM | POA: Diagnosis not present

## 2016-08-13 DIAGNOSIS — N926 Irregular menstruation, unspecified: Secondary | ICD-10-CM

## 2016-08-13 DIAGNOSIS — Z1231 Encounter for screening mammogram for malignant neoplasm of breast: Secondary | ICD-10-CM | POA: Diagnosis not present

## 2016-08-13 DIAGNOSIS — N939 Abnormal uterine and vaginal bleeding, unspecified: Secondary | ICD-10-CM | POA: Diagnosis not present

## 2016-08-13 DIAGNOSIS — E785 Hyperlipidemia, unspecified: Secondary | ICD-10-CM | POA: Diagnosis not present

## 2016-08-13 DIAGNOSIS — Z1239 Encounter for other screening for malignant neoplasm of breast: Secondary | ICD-10-CM

## 2016-08-13 MED ORDER — TRAMADOL HCL 50 MG PO TABS: 25.0000 mg | ORAL_TABLET | Freq: Two times a day (BID) | ORAL | 0 refills | Status: DC | PRN

## 2016-08-13 MED ORDER — CITALOPRAM HYDROBROMIDE 40 MG PO TABS: 40.0000 mg | ORAL_TABLET | Freq: Every day | ORAL | 2 refills | Status: DC

## 2016-08-13 MED ORDER — MEDROXYPROGESTERONE ACETATE 150 MG/ML IM SUSP: 150.0000 mg | INTRAMUSCULAR | 3 refills | Status: DC

## 2016-08-13 NOTE — Patient Instructions (Addendum)
Call for appt for mammogram: [facility] Appointment: [phone]  I put in referral to follow up at [facility] for meningioma  Start tramadol 1/2 to whole tab twice a day as needed for pain  Decrease xanax/alprazolam to one tab in the morning, half tab at night until I see you next After two weeks we are going to go down to half tab morning and night

## 2016-08-13 NOTE — Progress Notes (Signed)
Subjective:   Patient ID: Sheena Willis, female    DOB: 1970-09-09, 46 y.o.   MRN: [id number] CC: New Patient (Initial Visit) and Breast Pain (Left)  HPI: Sheena Willis is a 46 y.o. female presenting for New Patient (Initial Visit) and Breast Pain (Left)  New granddaughter in [place] to spend several months at a time taking care of her  Takes 2 hydrocodone around 2pm '5mg'$ , 1 hydrocodone at night Previously prescribed by PCP  Has throbbing in her head when she wakes up  Not getting worse Has bene stable Last MRI in 2016 Follows with Radiology Oncology at [facility], due for MRI this summer  Due for screening mammogram, gets them done every year  Has episodes of L "under the breast" pain underwire bra bothers it at times Bothers her a few times a week, sometimes more Not noticed any lumps or bumps No discharge Has never had an ultrasound Has sore nodules that come and go under L arm Usually notices after wearing something too tight  Takes depo-provera because period has been "devastating" Family friend gives her depo-provera shot  Anxiety: takes xanax 0.'5mg'$  twice a day Still sometimes overwhelmed by life circumstances On citalopram '20mg'$  daily  Vision L eye has been blurry past few months  Past Medical History:  Diagnosis Date  . Brain tumor (benign) ([place])   . Radiation    for brain tumors  . Vertigo    due to benign brain tumor   Family History  Problem Relation Age of Onset  . Cancer Maternal Aunt   . Diabetes Maternal Aunt   . Cancer Maternal Aunt    Social History   Social History  . Marital status: Divorced    Spouse name: N/A  . Number of children: N/A  . Years of education: N/A   Social History Main Topics  . Smoking status: Current Every Day Smoker    Packs/day: 0.25    Types: Cigarettes  . Smokeless tobacco: Never Used  . Alcohol use No  . Drug use: No  . Sexual activity: No   Other Topics Concern  . None   Social History Narrative    . None   ROS: All systems negative other than what is in HPI  Objective:    BP 105/65   Pulse 62   Temp (!) 97.4 F (36.3 C) (Oral)   Ht '5\' 6"'$  (1.676 m)   Wt 143 lb (64.9 kg)   BMI 23.08 kg/m   Wt Readings from Last 3 Encounters:  08/13/16 143 lb (64.9 kg)  05/04/16 145 lb (65.8 kg)  07/16/12 141 lb (64 kg)    Gen: NAD, alert, cooperative with exam, NCAT EYES: EOMI, no conjunctival injection, or no icterus ENT:  TMs pearly gray b/l, OP without erythema LYMPH: no cervical LAD CV: NRRR, normal S1/S2, no murmur, distal pulses 2+ b/l Resp: CTABL, no wheezes, normal WOB Abd: +BS, soft, NTND.  Ext: No edema, warm Neuro: Alert and oriented, strength equal b/l UE and LE, coordination grossly normal MSK: normal muscle bulk, hand grip equal b/l Psych: affect normal, mood is fine, no thoughts of self harm Breast: L breast with some densities Medial upper and lower quadrants, no LAD  Assessment & Plan:  Sheena Willis was seen today for new patient (initial visit) and breast pain.  Diagnoses and all orders for this visit:  Meningioma ([place]) Trial of tramadol for pain rtc 1 week for pain management Due for f/u appt per care everywhere notes -  traMADol (ULTRAM) 50 MG tablet; Take 0.5-1 tablets (25-50 mg total) by mouth every 12 (twelve) hours as needed. -     Ambulatory referral to Radiation Oncology  Breast pain -     MM Digital Diagnostic Unilat L; Future  Menstrual bleeding problem Cont below -     medroxyPROGESTERone (DEPO-PROVERA) 150 MG/ML injection; Inject 1 mL (150 mg total) into the muscle every 3 (three) months.  Screening for breast cancer -     MM Digital Screening; Future  Anxiety Decrease xanax to whole tab AM, half tab PM Increase celexa to '40mg'$  daily -     CMP14+EGFR -     citalopram (CELEXA) 40 MG tablet; Take 1 tablet (40 mg total) by mouth daily.  Hyperlipidemia, unspecified hyperlipidemia type On pravastatin for a while, has been of fof it, wants to know  if she has to continue to take, will recheck level -     Lipid panel   Follow up plan: Return in about 1 week (around 08/20/2016). For pain management Assunta Found, MD [place]

## 2016-08-14 LAB — CMP14+EGFR
ALT: 11 IU/L (ref 0–32)
AST: 15 IU/L (ref 0–40)
Albumin/Globulin Ratio: 2.5 — ABNORMAL HIGH (ref 1.2–2.2)
Albumin: 5 g/dL (ref 3.5–5.5)
Alkaline Phosphatase: 69 IU/L (ref 39–117)
BUN/Creatinine Ratio: 12 (ref 9–23)
BUN: 10 mg/dL (ref 6–24)
Bilirubin Total: 0.3 mg/dL (ref 0.0–1.2)
CALCIUM: 9.8 mg/dL (ref 8.7–10.2)
CO2: 21 mmol/L (ref 20–29)
CREATININE: 0.82 mg/dL (ref 0.57–1.00)
Chloride: 102 mmol/L (ref 96–106)
GFR calc Af Amer: 99 mL/min/{1.73_m2} (ref >59)
GFR, EST NON AFRICAN AMERICAN: 86 mL/min/{1.73_m2} (ref >59)
Globulin, Total: 2 g/dL (ref 1.5–4.5)
Glucose: 88 mg/dL (ref 65–99)
Potassium: 4.4 mmol/L (ref 3.5–5.2)
Sodium: 141 mmol/L (ref 134–144)
Total Protein: 7 g/dL (ref 6.0–8.5)

## 2016-08-14 LAB — LIPID PANEL
CHOL/HDL RATIO: 3.8 ratio (ref 0.0–4.4)
Cholesterol, Total: 211 mg/dL — ABNORMAL HIGH (ref 100–199)
HDL: 56 mg/dL (ref >39)
LDL CALC: 127 mg/dL — AB (ref 0–99)
TRIGLYCERIDES: 140 mg/dL (ref 0–149)
VLDL CHOLESTEROL CAL: 28 mg/dL (ref 5–40)

## 2016-08-20 ENCOUNTER — Ambulatory Visit: Payer: Medicare Other | Admitting: Pediatrics

## 2016-08-21 ENCOUNTER — Ambulatory Visit (HOSPITAL_COMMUNITY)
Admission: RE | Admit: 2016-08-21 | Discharge: 2016-08-21 | Disposition: A | Discharge status: Home / Self Care | Payer: Medicare Other | Source: Ambulatory Visit | Attending: Pediatrics | Admitting: Pediatrics

## 2016-08-21 DIAGNOSIS — R922 Inconclusive mammogram: Secondary | ICD-10-CM | POA: Diagnosis not present

## 2016-08-21 DIAGNOSIS — N6489 Other specified disorders of breast: Secondary | ICD-10-CM | POA: Diagnosis not present

## 2016-08-21 DIAGNOSIS — N644 Mastodynia: Secondary | ICD-10-CM

## 2016-08-22 DIAGNOSIS — D329 Benign neoplasm of meninges, unspecified: Secondary | ICD-10-CM | POA: Diagnosis not present

## 2016-08-27 ENCOUNTER — Telehealth: Payer: Self-pay | Admitting: Pediatrics

## 2016-08-27 NOTE — Telephone Encounter (Signed)
Next available appt would be Wednesday Sept. 12th

## 2016-08-30 NOTE — Telephone Encounter (Signed)
Pt is upset, she says she has another brain tumor and she is leaving the state on 09/08/16 and will be back the end of January. She needs to know how she could get her tramadol while gone. She said she will be seen if she needs to be but must be before 8th, states this is her 3rd call regarding this.

## 2016-08-30 NOTE — Telephone Encounter (Signed)
appt madefor 09/05/16 

## 2016-08-30 NOTE — Telephone Encounter (Signed)
OK to put her in 30 min appt 9/5

## 2016-09-05 ENCOUNTER — Encounter: Payer: Self-pay | Admitting: Pediatrics

## 2016-09-05 ENCOUNTER — Ambulatory Visit (INDEPENDENT_AMBULATORY_CARE_PROVIDER_SITE_OTHER): Payer: Medicare Other | Admitting: Pediatrics

## 2016-09-05 VITALS — BP 113/74 | HR 72 | Temp 97.0°F | Ht 66.0 in | Wt 142.0 lb

## 2016-09-05 DIAGNOSIS — N939 Abnormal uterine and vaginal bleeding, unspecified: Secondary | ICD-10-CM

## 2016-09-05 DIAGNOSIS — Z780 Asymptomatic menopausal state: Secondary | ICD-10-CM

## 2016-09-05 DIAGNOSIS — E785 Hyperlipidemia, unspecified: Secondary | ICD-10-CM | POA: Diagnosis not present

## 2016-09-05 DIAGNOSIS — F419 Anxiety disorder, unspecified: Secondary | ICD-10-CM

## 2016-09-05 MED ORDER — MEDROXYPROGESTERONE ACETATE 150 MG/ML IM SUSP: 150.0000 mg | INTRAMUSCULAR | 3 refills | Status: DC

## 2016-09-05 MED ORDER — PRAVASTATIN SODIUM 20 MG PO TABS: 20.0000 mg | ORAL_TABLET | Freq: Every day | ORAL | 1 refills | Status: DC

## 2016-09-05 MED ORDER — CITALOPRAM HYDROBROMIDE 40 MG PO TABS: 40.0000 mg | ORAL_TABLET | Freq: Every day | ORAL | 2 refills | Status: DC

## 2016-09-05 MED ORDER — ALPRAZOLAM 0.25 MG PO TABS: ORAL_TABLET | ORAL | 1 refills | Status: DC

## 2016-09-05 NOTE — Progress Notes (Signed)
  Subjective:   Patient ID: Sheena Willis, female    DOB: December 08, 1970, 46 y.o.   MRN: [id number] CC: discuss pain meds (Discuss tumors that were found, wants bone density test)  HPI: Sheena Willis is a 46 y.o. female presenting for discuss pain meds (Discuss tumors that were found, wants bone density test)  Meningioma--going to get procedure 02/05/2017 for new meningioma from 2012 Following at [facility]  Pain--tried tramadol, isnt sure if it helped or not Says after discussing symptoms with Dr. Michaelle Birks, she thinks she is more bothered by the sound of whooshing in her L ear, the side that she had previous removal of meningioma, than pain Had numbness, tingling following surgery years ago that has improved since then Has occasional sharp pain in back of neck, sometimes in ear, last for seconds, doesn't bother her much because goes away quickly  Has noticed balance off for past few years  Anxiety: taking 40mg  celexa, helping some Takes 0.5mg  xanax prn, has Rx from prior provider Not taking every day, takes in day to help with anxiety Leaving in 3 days for 5 months to babysit granddaughter in [place] Coming back in Feb for procedure as above  Continues on depo-provera Very worried about bone denisty Says it had been checked in the past, she doesn't remember if it was normal or not No h/o fracture Smokes daily Not having periods while on depoprovera Having more hot flashes  Relevant past medical, surgical, family and social history reviewed. Allergies and medications reviewed and updated. History  Smoking Status  . Current Every Day Smoker  . Packs/day: 0.25  . Types: Cigarettes  Smokeless Tobacco  . Never Used   ROS: Per HPI   Objective:    BP 113/74   Pulse 72   Temp (!) 97 F (36.1 C) (Oral)   Ht 5\' 6"  (1.676 m)   Wt 142 lb (64.4 kg)   BMI 22.92 kg/m   Wt Readings from Last 3 Encounters:  09/05/16 142 lb (64.4 kg)  08/13/16 143 lb (64.9 kg)  05/04/16 145 lb  (65.8 kg)    Gen: NAD, alert, cooperative with exam, NCAT EYES: EOMI, no conjunctival injection, or no icterus ENT:  OP without erythema CV: NRRR, normal S1/S2, no murmur, distal pulses 2+ b/l Resp: CTABL, no wheezes, normal WOB Abd: +BS, soft, NTND. no guarding or organomegaly Ext: No edema, warm Neuro: Alert and oriented  Assessment & Plan:  Fol[place]ade was seen today for discuss pain meds.  Diagnoses and all orders for this visit:  Post-menopausal On depo-provera, smoker -     DG [facility] DEXA  Anxiety Ongoing, cont citalopram Use xanax as needed Consider switch to long acting in future Does help with anxiety symptoms now -     citalopram (CELEXA) 40 MG tablet; Take 1 tablet (40 mg total) by mouth daily. -     ALPRAZolam (XANAX) 0.25 MG tablet; No more than 2 times a day  Menstrual bleeding problem Cont below -     medroxyPROGESTERone (DEPO-PROVERA) 150 MG/ML injection; Inject 1 mL (150 mg total) into the muscle every 3 (three) months.  Hyperlipidemia, unspecified hyperlipidemia type Stable, cont below -     pravastatin (PRAVACHOL) 20 MG tablet; Take 1 tablet (20 mg total) by mouth daily.   Follow up plan: 4 mo Assunta Found, MD [place]

## 2016-09-05 NOTE — Patient Instructions (Signed)
Ibuprofen/motrin 400mg  three times a day (can do liquid)

## 2016-09-06 ENCOUNTER — Telehealth: Payer: Self-pay | Admitting: Pediatrics

## 2016-09-06 NOTE — Telephone Encounter (Signed)
Per [facility], pt had Xanax 0.5mg  #60 refill left from RX written by Dr Melina Copa  Pt is also trying to refill Xanax RX written by you yesterday Please advise

## 2016-09-06 NOTE — Telephone Encounter (Signed)
Pharmacy aware that the alprazolam 0.5 should be discontinued and they should fill the 0.25

## 2016-09-06 NOTE — Telephone Encounter (Signed)
Can only fill one. Ideally pt would agree to cancel prior Rx and fill the one I gave her.

## 2017-01-01 DIAGNOSIS — I639 Cerebral infarction, unspecified: Secondary | ICD-10-CM

## 2017-01-01 HISTORY — DX: Cerebral infarction, unspecified: I63.9

## 2017-02-04 DIAGNOSIS — Z510 Encounter for antineoplastic radiation therapy: Secondary | ICD-10-CM | POA: Diagnosis not present

## 2017-02-04 DIAGNOSIS — D329 Benign neoplasm of meninges, unspecified: Secondary | ICD-10-CM | POA: Diagnosis not present

## 2017-02-04 DIAGNOSIS — D320 Benign neoplasm of cerebral meninges: Secondary | ICD-10-CM | POA: Diagnosis not present

## 2017-02-05 DIAGNOSIS — D329 Benign neoplasm of meninges, unspecified: Secondary | ICD-10-CM | POA: Diagnosis not present

## 2017-02-05 DIAGNOSIS — Z510 Encounter for antineoplastic radiation therapy: Secondary | ICD-10-CM | POA: Diagnosis not present

## 2017-02-07 ENCOUNTER — Other Ambulatory Visit: Payer: Self-pay | Admitting: Pediatrics

## 2017-02-07 DIAGNOSIS — F419 Anxiety disorder, unspecified: Secondary | ICD-10-CM

## 2017-02-15 ENCOUNTER — Telehealth: Payer: Self-pay | Admitting: Pediatrics

## 2017-02-15 DIAGNOSIS — E785 Hyperlipidemia, unspecified: Secondary | ICD-10-CM

## 2017-02-15 MED ORDER — PRAVASTATIN SODIUM 20 MG PO TABS: 20.0000 mg | ORAL_TABLET | Freq: Every day | ORAL | 0 refills | Status: DC

## 2017-02-15 NOTE — Telephone Encounter (Signed)
appt made for 03/08/17 at 3:30pm.  Enough pravastatin sent to pharmacy until OV

## 2017-03-01 ENCOUNTER — Other Ambulatory Visit: Payer: Self-pay | Admitting: Pediatrics

## 2017-03-01 DIAGNOSIS — E785 Hyperlipidemia, unspecified: Secondary | ICD-10-CM

## 2017-03-08 ENCOUNTER — Encounter: Payer: Self-pay | Admitting: Pediatrics

## 2017-03-08 ENCOUNTER — Ambulatory Visit (INDEPENDENT_AMBULATORY_CARE_PROVIDER_SITE_OTHER): Payer: Medicare Other | Admitting: Pediatrics

## 2017-03-08 VITALS — BP 95/70 | HR 73 | Temp 98.4°F | Ht 66.0 in | Wt 145.8 lb

## 2017-03-08 DIAGNOSIS — N939 Abnormal uterine and vaginal bleeding, unspecified: Secondary | ICD-10-CM | POA: Diagnosis not present

## 2017-03-08 DIAGNOSIS — F419 Anxiety disorder, unspecified: Secondary | ICD-10-CM | POA: Diagnosis not present

## 2017-03-08 DIAGNOSIS — I6522 Occlusion and stenosis of left carotid artery: Secondary | ICD-10-CM | POA: Diagnosis not present

## 2017-03-08 DIAGNOSIS — E785 Hyperlipidemia, unspecified: Secondary | ICD-10-CM

## 2017-03-08 MED ORDER — MEDROXYPROGESTERONE ACETATE 150 MG/ML IM SUSP: 150.0000 mg | INTRAMUSCULAR | 3 refills | Status: DC

## 2017-03-08 MED ORDER — PRAVASTATIN SODIUM 20 MG PO TABS: 20.0000 mg | ORAL_TABLET | Freq: Every day | ORAL | 3 refills | Status: DC

## 2017-03-08 MED ORDER — ALPRAZOLAM 0.25 MG PO TABS: ORAL_TABLET | ORAL | 2 refills | Status: DC

## 2017-03-08 MED ORDER — CITALOPRAM HYDROBROMIDE 40 MG PO TABS: 40.0000 mg | ORAL_TABLET | Freq: Every day | ORAL | 2 refills | Status: DC

## 2017-03-08 NOTE — Progress Notes (Signed)
Subjective:   Patient ID: Sheena Willis, female    DOB: 08-25-1970, 47 y.o.   MRN: [id number] CC: Medication Refill and MRI Results  HPI: Sheena Willis is a 47 y.o. female presenting for Medication Refill and MRI Results  Recent procedure with radiation oncology for meningioma.  Patient says that she will be getting at least yearly scans with MRI to follow meningiomas.  Most recent MRI again showed high-grade stenosis versus occlusion of the left internal carotid artery at the skull base.  Has not had a CTA to evaluate this yet.  Patient with no symptoms.  Anxiety ongoing.  She says it is fairly well controlled taking the citalopram 40 mg daily.  Takes Xanax 1-2 times a day.  She is trying to minimize use.  Menorrhagia: Takes Depoprovera shots every 3 months to prevent periods.  Not currently sexually active.  Back from recent trip to [place] to see her granddaughter which she enjoyed.  Heading back there at the beginning of June.  Hyperlipidemia: Taking pravastatin every day.  Has a feeling in the left side of her throat/neck when she swallows feels different than the other side.  Does not hurt.  Food does not get stuck but she says if she had the feeling on both sides she thinks it would be hard to swallow.  Started after sleeping with a pillow tucked up underneath her neck a few weeks ago.  Had not have this feeling before.  Feeling is present daily, no improvement with time  Relevant past medical, surgical, family and social history reviewed. Allergies and medications reviewed and updated. Social History   Tobacco Use  Smoking Status Current Every Day Smoker  . Packs/day: 0.25  . Types: Cigarettes  Smokeless Tobacco Never Used   ROS: Per HPI   Objective:    BP 95/70   Pulse 73   Temp 98.4 F (36.9 C) (Oral)   Ht 5\' 6"  (1.676 m)   Wt 145 lb 12.8 oz (66.1 kg)   BMI 23.53 kg/m   Wt Readings from Last 3 Encounters:  03/08/17 145 lb 12.8 oz (66.1 kg)  09/05/16 142 lb  (64.4 kg)  08/13/16 143 lb (64.9 kg)    Gen: NAD, alert, cooperative with exam, NCAT EYES: EOMI, no conjunctival injection, or no icterus LYMPH: no cervical LAD Neck: Appears normal. CV: NRRR, normal S1/S2, no murmur, distal pulses 2+ b/l Resp: CTABL, no wheezes, normal WOB Abd: +BS, soft, NTND. no guarding or organomegaly Ext: No edema, warm Neuro: Alert and oriented, strength equal b/l UE and LE, coordination grossly normal MSK: normal muscle bulk  Assessment & Plan:  Sheena Willis was seen today for medication refill and mri results.  Diagnoses and all orders for this visit:  Stenosis of left carotid artery Will get CTA for further evaluation. -     CT ANGIO HEAD W OR WO CONTRAST; Future  Anxiety Stable, continue below -     ALPRAZolam (XANAX) 0.25 MG tablet; No more than 2 times a day -     citalopram (CELEXA) 40 MG tablet; Take 1 tablet (40 mg total) by mouth daily.  Menstrual bleeding problem Stable, continue below -     medroxyPROGESTERone (DEPO-PROVERA) 150 MG/ML injection; Inject 1 mL (150 mg total) into the muscle every 3 (three) months.  Hyperlipidemia, unspecified hyperlipidemia type Stable, continue below -     pravastatin (PRAVACHOL) 20 MG tablet; Take 1 tablet (20 mg total) by mouth daily.  Meningiomas Follows with radiation oncology.  Per patient will be getting at least yearly MRIs for follow-up.  Recovered well from recent gamma knife procedure.  Follow up plan: Return in about 2 months (around 05/08/2017). Assunta Found, MD [place]

## 2017-03-11 ENCOUNTER — Telehealth: Payer: Self-pay

## 2017-03-11 NOTE — Telephone Encounter (Signed)
Last time she went to pharmacy [facility] med was wrong  Came in a vial with no needle  Had to go back in and they gave her the right thing but said the vial was how you wrote it   She needs to make sure its in her chart correctly to get shot with a needle attached because she does not come in here to get it given.

## 2017-03-12 NOTE — Telephone Encounter (Signed)
Spoke with Lattie Haw at [facility].  She reports the prescription was sent in correctly and should have been dispensed as the injection with the needle, but [facility] was [facility].  She will put a [facility] stating she is to receive her Depo-Provera as an injection not a vial.  Spoke with patient and she is aware.

## 2017-03-12 NOTE — Telephone Encounter (Signed)
Can you call pharmacy, make sure correct depo-provera shot is ordered. OK to send in 1 with 3 refills.

## 2017-03-20 ENCOUNTER — Ambulatory Visit (HOSPITAL_COMMUNITY): Payer: Medicare Other

## 2017-03-26 ENCOUNTER — Ambulatory Visit (HOSPITAL_COMMUNITY)
Admission: RE | Admit: 2017-03-26 | Discharge: 2017-03-26 | Disposition: A | Discharge status: Home / Self Care | Payer: Medicare Other | Source: Ambulatory Visit | Attending: Pediatrics | Admitting: Pediatrics

## 2017-03-26 DIAGNOSIS — D320 Benign neoplasm of cerebral meninges: Secondary | ICD-10-CM | POA: Diagnosis not present

## 2017-03-26 DIAGNOSIS — I6522 Occlusion and stenosis of left carotid artery: Secondary | ICD-10-CM | POA: Diagnosis not present

## 2017-03-26 DIAGNOSIS — I6523 Occlusion and stenosis of bilateral carotid arteries: Secondary | ICD-10-CM | POA: Diagnosis not present

## 2017-03-26 MED ORDER — IOPAMIDOL (ISOVUE-370) INJECTION 76%
80.0000 mL | Freq: Once | INTRAVENOUS | Status: AC | PRN
  Administered 2017-03-26: 75 mL via INTRAVENOUS

## 2017-04-03 ENCOUNTER — Other Ambulatory Visit: Payer: Self-pay | Admitting: Pediatrics

## 2017-04-03 DIAGNOSIS — R070 Pain in throat: Secondary | ICD-10-CM

## 2017-04-15 ENCOUNTER — Telehealth: Payer: Self-pay | Admitting: Pediatrics

## 2017-04-15 NOTE — Telephone Encounter (Signed)
needs appointment to be seen

## 2017-04-15 NOTE — Telephone Encounter (Signed)
Patient made apt 4/24 at 3pm with Dr. Evette Doffing- states she will keep and eye on it and if it gets worse before then she will call back.

## 2017-04-24 ENCOUNTER — Ambulatory Visit (INDEPENDENT_AMBULATORY_CARE_PROVIDER_SITE_OTHER): Payer: Medicare Other | Admitting: Pediatrics

## 2017-04-24 ENCOUNTER — Encounter: Payer: Self-pay | Admitting: Pediatrics

## 2017-04-24 VITALS — BP 105/67 | HR 76 | Temp 99.1°F | Ht 66.0 in | Wt 146.8 lb

## 2017-04-24 DIAGNOSIS — D329 Benign neoplasm of meninges, unspecified: Secondary | ICD-10-CM | POA: Diagnosis not present

## 2017-04-24 DIAGNOSIS — I6522 Occlusion and stenosis of left carotid artery: Secondary | ICD-10-CM | POA: Diagnosis not present

## 2017-04-24 NOTE — Progress Notes (Addendum)
  Subjective:   Patient ID: Sheena Willis, female    DOB: 01/26/1970, 47 y.o.   MRN: [id number] CC: Growth on head  HPI: Sheena Willis is a 47 y.o. female presenting for knot on head  Over the last 4 weeks has noticed about a quarter size swelling on the top of her head.  She is not sure when exactly it started, noticed it when redoing her hair about 4 weeks ago.  It is tender to palpation.  She says she had gamma knife procedure to area close to this on February 5 at [facility].  She had a CTA scan to follow-up internal carotid obstruction about 4 weeks ago.  Right frontal bone calvarial mass likely additional meningioma was present on that scan.  Continues to have left tonsillar/anterior neck pain.  Sometimes feels like food is slow to get down.  Has an appointment in a couple weeks to see her ear nose and throat doctor.  No weakness, vision changes, lightheadedness.  Planning on going to [place] to see her granddaughter in June.  Relevant past medical, surgical, family and social history reviewed. Allergies and medications reviewed and updated. Social History   Tobacco Use  Smoking Status Current Every Day Smoker  . Packs/day: 0.25  . Types: Cigarettes  Smokeless Tobacco Never Used   ROS: Per HPI   Objective:    BP 105/67   Pulse 76   Temp 99.1 F (37.3 C) (Oral)   Ht 5\' 6"  (1.676 m)   Wt 146 lb 12.8 oz (66.6 kg)   BMI 23.69 kg/m   Wt Readings from Last 3 Encounters:  04/24/17 146 lb 12.8 oz (66.6 kg)  03/08/17 145 lb 12.8 oz (66.1 kg)  09/05/16 142 lb (64.4 kg)    Gen: NAD, alert, cooperative with exam, NCAT EYES: EOMI, no conjunctival injection, or no icterus ENT:  TMs pearly gray b/l, OP without erythema LYMPH: no cervical LAD NECK: ttp left anterior upper neck. CV: NRRR, normal S1/S2, no murmur, distal pulses 2+ b/l Resp: CTABL, no wheezes, normal WOB Ext: No edema, warm Neuro: Alert and oriented, strength equal b/l UE and LE, coordination grossly  normal MSK: Approximately quarter sized firm nodule top left side of skull.  Mildly tender to palpation.  No redness.  Assessment & Plan:  Sheena Willis was seen today for head nodule.  Diagnoses and all orders for this visit:  Meningioma ([place]) New palpable mass on top of head, tender to palpation.  Corresponds to place on CT scan with enlarged meningioma.  Will get patient back into see Dr. Vallarie Mare. -     Ambulatory referral to Radiation Oncology   Follow up plan: As scheduled. Assunta Found, MD [place]

## 2017-05-13 ENCOUNTER — Ambulatory Visit (INDEPENDENT_AMBULATORY_CARE_PROVIDER_SITE_OTHER): Payer: Medicare Other | Admitting: Otolaryngology

## 2017-05-13 DIAGNOSIS — R1312 Dysphagia, oropharyngeal phase: Secondary | ICD-10-CM | POA: Diagnosis not present

## 2017-05-13 DIAGNOSIS — R070 Pain in throat: Secondary | ICD-10-CM | POA: Diagnosis not present

## 2017-05-14 ENCOUNTER — Other Ambulatory Visit (INDEPENDENT_AMBULATORY_CARE_PROVIDER_SITE_OTHER): Payer: Self-pay | Admitting: Otolaryngology

## 2017-05-14 DIAGNOSIS — R1310 Dysphagia, unspecified: Secondary | ICD-10-CM

## 2017-05-16 ENCOUNTER — Ambulatory Visit (INDEPENDENT_AMBULATORY_CARE_PROVIDER_SITE_OTHER): Payer: Medicare Other | Admitting: Pediatrics

## 2017-05-16 ENCOUNTER — Encounter: Payer: Self-pay | Admitting: Pediatrics

## 2017-05-16 VITALS — BP 101/65 | HR 80 | Temp 98.5°F | Ht 66.0 in | Wt 143.4 lb

## 2017-05-16 DIAGNOSIS — D329 Benign neoplasm of meninges, unspecified: Secondary | ICD-10-CM | POA: Diagnosis not present

## 2017-05-16 DIAGNOSIS — I6522 Occlusion and stenosis of left carotid artery: Secondary | ICD-10-CM

## 2017-05-16 DIAGNOSIS — F419 Anxiety disorder, unspecified: Secondary | ICD-10-CM | POA: Diagnosis not present

## 2017-05-16 DIAGNOSIS — R070 Pain in throat: Secondary | ICD-10-CM

## 2017-05-16 MED ORDER — ALPRAZOLAM 0.25 MG PO TABS: ORAL_TABLET | ORAL | 2 refills | Status: DC

## 2017-05-16 NOTE — Progress Notes (Signed)
  Subjective:   Patient ID: Sheena Willis, female    DOB: December 30, 1970, 47 y.o.   MRN: [id number] CC: Medical Management of Chronic Issues  HPI: Sheena Willis is a 47 y.o. female   Anxiety: Symptoms have been stable.  Takes Xanax twice a day, also on citalopram  Meningiomas: Following at [facility].  New palpable area on her school she has been reassured by specialists is to be expected.  Swelling difficulty: Has upcoming study to evaluate her swallow function.  Appetite is been fine.  Planning on going to [place] to see her granddaughter within the next month.  Relevant past medical, surgical, family and social history reviewed. Allergies and medications reviewed and updated. Social History   Tobacco Use  Smoking Status Current Every Day Smoker  . Packs/day: 0.25  . Types: Cigarettes  Smokeless Tobacco Never Used   ROS: Per HPI   Objective:    BP 101/65   Pulse 80   Temp 98.5 F (36.9 C) (Oral)   Ht 5\' 6"  (1.676 m)   Wt 143 lb 6.4 oz (65 kg)   BMI 23.15 kg/m   Wt Readings from Last 3 Encounters:  05/16/17 143 lb 6.4 oz (65 kg)  04/24/17 146 lb 12.8 oz (66.6 kg)  03/08/17 145 lb 12.8 oz (66.1 kg)    Gen: NAD, alert, cooperative with exam, NCAT EYES: EOMI, no conjunctival injection, or no icterus ENT:  TMs pearly gray b/l, OP without erythema Neck: Normal thyroid LYMPH: no cervical LAD CV: NRRR, normal S1/S2, no murmur, distal pulses 2+ b/l Resp: CTABL, no wheezes, normal WOB Abd: +BS, soft, NTND. no guarding or organomegaly Ext: No edema, warm Neuro: Alert and oriented MSK: normal muscle bulk  Assessment & Plan:  Sheena Willis was seen today for medical management of chronic issues.  Diagnoses and all orders for this visit:  Anxiety Stable on current medicines.  Continue site Lexa, continue alprazolam as needed.  We will continue to assess need and wean as able. -     ALPRAZolam (XANAX) 0.25 MG tablet; No more than 2 times a day  Meningioma ([facility]) Stable, has  follow-up scheduled with specialists  Throat pain Has upcoming study with your nose and throat doctor to evaluate swallowing.  No weight loss, no fevers.  Follow up plan: Return in about 3 months (around 08/16/2017). Assunta Found, MD [place]

## 2017-05-29 ENCOUNTER — Ambulatory Visit (HOSPITAL_COMMUNITY)
Admission: RE | Admit: 2017-05-29 | Discharge: 2017-05-29 | Disposition: A | Discharge status: Home / Self Care | Payer: Medicare Other | Source: Ambulatory Visit | Attending: Otolaryngology | Admitting: Otolaryngology

## 2017-05-29 DIAGNOSIS — R1110 Vomiting, unspecified: Secondary | ICD-10-CM | POA: Diagnosis not present

## 2017-05-29 DIAGNOSIS — R1310 Dysphagia, unspecified: Secondary | ICD-10-CM | POA: Insufficient documentation

## 2017-09-04 DIAGNOSIS — D320 Benign neoplasm of cerebral meninges: Secondary | ICD-10-CM | POA: Diagnosis not present

## 2017-10-28 DIAGNOSIS — Z510 Encounter for antineoplastic radiation therapy: Secondary | ICD-10-CM | POA: Diagnosis not present

## 2017-10-28 DIAGNOSIS — D329 Benign neoplasm of meninges, unspecified: Secondary | ICD-10-CM | POA: Diagnosis not present

## 2017-10-28 DIAGNOSIS — D320 Benign neoplasm of cerebral meninges: Secondary | ICD-10-CM | POA: Diagnosis not present

## 2017-10-29 DIAGNOSIS — Z510 Encounter for antineoplastic radiation therapy: Secondary | ICD-10-CM | POA: Diagnosis not present

## 2017-10-29 DIAGNOSIS — D329 Benign neoplasm of meninges, unspecified: Secondary | ICD-10-CM | POA: Diagnosis not present

## 2017-10-30 DIAGNOSIS — Z510 Encounter for antineoplastic radiation therapy: Secondary | ICD-10-CM | POA: Diagnosis not present

## 2017-10-30 DIAGNOSIS — D329 Benign neoplasm of meninges, unspecified: Secondary | ICD-10-CM | POA: Diagnosis not present

## 2017-10-31 DIAGNOSIS — Z510 Encounter for antineoplastic radiation therapy: Secondary | ICD-10-CM | POA: Diagnosis not present

## 2017-10-31 DIAGNOSIS — D329 Benign neoplasm of meninges, unspecified: Secondary | ICD-10-CM | POA: Diagnosis not present

## 2017-11-01 DIAGNOSIS — D329 Benign neoplasm of meninges, unspecified: Secondary | ICD-10-CM | POA: Diagnosis not present

## 2017-11-01 DIAGNOSIS — Z510 Encounter for antineoplastic radiation therapy: Secondary | ICD-10-CM | POA: Diagnosis not present

## 2017-12-30 ENCOUNTER — Telehealth: Payer: Self-pay | Admitting: Pediatrics

## 2017-12-30 NOTE — Telephone Encounter (Signed)
Agree 

## 2017-12-30 NOTE — Telephone Encounter (Signed)
Patient reports she has been having episodes of numbness and tingling in the right side of her body.  She is out of town at this time, in [place], and will not be back until next week.  She asked for an appointment to see you when she comes back but I advised her she needed to be seen in the ER for evaluation.  Patient hesitates to go and insisted that I make an appointment for her for next week.  I scheduled her for 01/08/2018 but advised her again of the dangers of waiting and recommended she go to the ER.

## 2018-01-08 ENCOUNTER — Ambulatory Visit (INDEPENDENT_AMBULATORY_CARE_PROVIDER_SITE_OTHER): Payer: Medicare Other | Admitting: Pediatrics

## 2018-01-08 ENCOUNTER — Encounter: Payer: Self-pay | Admitting: Pediatrics

## 2018-01-08 VITALS — BP 110/71 | HR 74 | Temp 98.0°F | Ht 66.0 in | Wt 145.0 lb

## 2018-01-08 DIAGNOSIS — R200 Anesthesia of skin: Secondary | ICD-10-CM

## 2018-01-08 DIAGNOSIS — F419 Anxiety disorder, unspecified: Secondary | ICD-10-CM

## 2018-01-08 MED ORDER — CITALOPRAM HYDROBROMIDE 40 MG PO TABS: 40.0000 mg | ORAL_TABLET | Freq: Every day | ORAL | 2 refills | Status: DC

## 2018-01-08 MED ORDER — ALPRAZOLAM 0.25 MG PO TABS: ORAL_TABLET | ORAL | 2 refills | Status: DC

## 2018-01-08 NOTE — Progress Notes (Signed)
  Subjective:   Patient ID: Sheena Willis, female    DOB: 08/28/1970, 48 y.o.   MRN: [id number] CC: No chief complaint on file.  HPI: Sheena Willis is a 48 y.o. female   Two days before thanksgiving, felt like R side of body numb, no trouble walking, lasted 6 hours.  12/25 when she was in [place] woke up with R side numbness again, couldn't use R arm normally, was walking ok. The next day didn't wake up until   Past week feels like arms are "trading places" with being numb. Portions of fingers at times numb.  Symptoms do not stay but do come and go.  History of meningiomas.  Follows with radiation oncology [facility].  Anxiety: She does think the Celexa has been helping.  Takes alprazolam for breakthrough moments of anxiety.  Relevant past medical, surgical, family and social history reviewed. Allergies and medications reviewed and updated. Social History   Tobacco Use  Smoking Status Current Every Day Smoker  . Packs/day: 0.25  . Types: Cigarettes  Smokeless Tobacco Never Used   ROS: Per HPI   Objective:    BP 110/71   Pulse 74   Temp 98 F (36.7 C) (Oral)   Ht 5\' 6"  (1.676 m)   Wt 145 lb (65.8 kg)   BMI 23.40 kg/m   Wt Readings from Last 3 Encounters:  01/09/18 145 lb (65.8 kg)  01/08/18 145 lb (65.8 kg)  05/16/17 143 lb 6.4 oz (65 kg)    Gen: NAD, alert, cooperative with exam, NCAT EYES: EOMI, no conjunctival injection, or no icterus ENT:  TMs pearly gray b/l, OP without erythema LYMPH: no cervical LAD CV: NRRR, normal S1/S2, no murmur, distal pulses 2+ b/l Resp: CTABL, no wheezes, normal WOB Abd: +BS, soft, NTND. no guarding or organomegaly Ext: No edema, warm Neuro: Alert and oriented, strength equal b/l UE and LE, coordination normal, sensation intact bilateral lower extremities and upper extremities, cranial nerves III -XII intact MSK: normal muscle bulk  Assessment & Plan:  Diagnoses and all orders for this visit:  Numbness Intermittent. Last MRI  10/29/2017 at [facility].  Patient to call specialist at [facility] today with new symptoms. -     Ambulatory referral to Neurology  Anxiety Stable, continue below. -     citalopram (CELEXA) 40 MG tablet; Take 1 tablet (40 mg total) by mouth daily. -     ALPRAZolam (XANAX) 0.25 MG tablet; No more than 2 times a day   Follow up plan: Return in about 3 months (around 04/09/2018). Assunta Found, MD [place]

## 2018-01-09 ENCOUNTER — Emergency Department (HOSPITAL_COMMUNITY): Payer: Medicare Other

## 2018-01-09 ENCOUNTER — Telehealth: Payer: Self-pay | Admitting: Pediatrics

## 2018-01-09 ENCOUNTER — Other Ambulatory Visit: Payer: Self-pay

## 2018-01-09 ENCOUNTER — Emergency Department (HOSPITAL_COMMUNITY)
Admission: EM | Admit: 2018-01-09 | Discharge: 2018-01-09 | Disposition: A | Discharge status: Home / Self Care | Payer: Medicare Other | Attending: Emergency Medicine | Admitting: Emergency Medicine

## 2018-01-09 DIAGNOSIS — R200 Anesthesia of skin: Secondary | ICD-10-CM | POA: Diagnosis not present

## 2018-01-09 DIAGNOSIS — Z5320 Procedure and treatment not carried out because of patient's decision for unspecified reasons: Secondary | ICD-10-CM | POA: Diagnosis not present

## 2018-01-09 DIAGNOSIS — F17210 Nicotine dependence, cigarettes, uncomplicated: Secondary | ICD-10-CM | POA: Diagnosis not present

## 2018-01-09 DIAGNOSIS — Z86011 Personal history of benign neoplasm of the brain: Secondary | ICD-10-CM | POA: Insufficient documentation

## 2018-01-09 DIAGNOSIS — D320 Benign neoplasm of cerebral meninges: Secondary | ICD-10-CM | POA: Diagnosis not present

## 2018-01-09 DIAGNOSIS — Z79899 Other long term (current) drug therapy: Secondary | ICD-10-CM | POA: Diagnosis not present

## 2018-01-09 LAB — COMPREHENSIVE METABOLIC PANEL
ALT: 13 U/L (ref 0–44)
AST: 13 U/L — ABNORMAL LOW (ref 15–41)
Albumin: 4.3 g/dL (ref 3.5–5.0)
Alkaline Phosphatase: 62 U/L (ref 38–126)
Anion gap: 6 (ref 5–15)
BUN: 9 mg/dL (ref 6–20)
CALCIUM: 9.1 mg/dL (ref 8.9–10.3)
CHLORIDE: 106 mmol/L (ref 98–111)
CO2: 25 mmol/L (ref 22–32)
CREATININE: 0.94 mg/dL (ref 0.44–1.00)
GFR calc non Af Amer: >60 mL/min (ref >60)
Glucose, Bld: 82 mg/dL (ref 70–99)
Potassium: 3.5 mmol/L (ref 3.5–5.1)
Sodium: 137 mmol/L (ref 135–145)
TOTAL PROTEIN: 7.2 g/dL (ref 6.5–8.1)
Total Bilirubin: 0.5 mg/dL (ref 0.3–1.2)

## 2018-01-09 LAB — I-STAT TROPONIN, ED: TROPONIN I, POC: 0 ng/mL (ref 0.00–0.08)

## 2018-01-09 LAB — CBC
HCT: 42.2 % (ref 36.0–46.0)
HEMOGLOBIN: 13.5 g/dL (ref 12.0–15.0)
MCH: 28.1 pg (ref 26.0–34.0)
MCHC: 32 g/dL (ref 30.0–36.0)
MCV: 87.7 fL (ref 80.0–100.0)
PLATELETS: 285 10*3/uL (ref 150–400)
RBC: 4.81 MIL/uL (ref 3.87–5.11)
RDW: 14.2 % (ref 11.5–15.5)
WBC: 8.5 10*3/uL (ref 4.0–10.5)
nRBC: 0 % (ref 0.0–0.2)

## 2018-01-09 LAB — I-STAT CHEM 8, ED
BUN: 7 mg/dL (ref 6–20)
Calcium, Ion: 1.24 mmol/L (ref 1.15–1.40)
Chloride: 102 mmol/L (ref 98–111)
Creatinine, Ser: 1 mg/dL (ref 0.44–1.00)
Glucose, Bld: 81 mg/dL (ref 70–99)
HEMATOCRIT: 44 % (ref 36.0–46.0)
HEMOGLOBIN: 15 g/dL (ref 12.0–15.0)
POTASSIUM: 3.6 mmol/L (ref 3.5–5.1)
Sodium: 140 mmol/L (ref 135–145)
TCO2: 25 mmol/L (ref 22–32)

## 2018-01-09 LAB — PROTIME-INR
INR: 0.97
Prothrombin Time: 12.8 seconds (ref 11.4–15.2)

## 2018-01-09 LAB — DIFFERENTIAL
Abs Immature Granulocytes: 0.03 10*3/uL (ref 0.00–0.07)
BASOS PCT: 1 %
Basophils Absolute: 0.1 10*3/uL (ref 0.0–0.1)
Eosinophils Absolute: 0.3 10*3/uL (ref 0.0–0.5)
Eosinophils Relative: 3 %
Immature Granulocytes: 0 %
LYMPHS ABS: 2.9 10*3/uL (ref 0.7–4.0)
Lymphocytes Relative: 34 %
MONOS PCT: 7 %
Monocytes Absolute: 0.6 10*3/uL (ref 0.1–1.0)
NEUTROS ABS: 4.7 10*3/uL (ref 1.7–7.7)
NEUTROS PCT: 55 %

## 2018-01-09 LAB — I-STAT BETA HCG BLOOD, ED (MC, WL, AP ONLY): I-stat hCG, quantitative: <5 m[IU]/mL (ref <5)

## 2018-01-09 LAB — ETHANOL

## 2018-01-09 LAB — APTT: APTT: 30 s (ref 24–36)

## 2018-01-09 NOTE — ED Notes (Signed)
Pt states she cannot urinate at this time, aware of DO

## 2018-01-09 NOTE — ED Notes (Signed)
Patient transported to CT 

## 2018-01-09 NOTE — ED Triage Notes (Signed)
Pt states that she has had numbness from her shoulders to waist that has been going on since christmas day.

## 2018-01-09 NOTE — ED Provider Notes (Signed)
[facility] EMERGENCY DEPARTMENT Provider Note   CSN: [id number] Arrival date & time: 01/09/18  1447     History   Chief Complaint Chief Complaint  Patient presents with  . Numbness    HPI Sheena Willis is a 48 y.o. female.  HPI  The patient is a 48 year old female, she has a known history of multiple meningiomas, history of gamma knife surgery to the brainstem because of meningioma and reports that she started having symptoms on Christmas Day approximately 16 days ago.  She reports that on that day she was having some difficulty with sensation on the right side of her body between her shoulder and her waist, at the time it did not involve the arm or the leg or the face, it has been rather persistent over the last 2 weeks but she is also noted some intermittent other symptoms including some numbness of the right leg, of the right face, and last night she was dragging her right leg behind her when she was walking because it would not work right.  She did see her family doctor yesterday and she reports that the doctor recommended that she be evaluated by her oncologist for possible causation being related to the meningiomas as well as the neurologist.  Notes are not available at this time to get the physicians direct documentation.  The patient reports that this morning she was not having the weakness but continues to have the numbness and is concerned about that.  She reports that on December 26, the second of symptoms she was awake in the bed but could not get out of the bed, she could not speak, she laid in bed until 2:30 in the afternoon when she finally got out of bed and felt the right-sided symptoms persisting.  She was advised to go to the emergency department in [place] but declined, she denies fevers chills nausea vomiting or diarrhea.  He has no history of stroke or heart disease.  Past Medical History:  Diagnosis Date  . Brain tumor (benign) ([place])   . Radiation    for brain  tumors  . Vertigo    due to benign brain tumor    Patient Active Problem List   Diagnosis Date Noted  . Meningioma ([place]) 08/06/2011    Past Surgical History:  Procedure Laterality Date  . BRAIN BIOPSY  12/05/2009  . HALO APPLICATION     done before gamma knife radio surgery  . LESION REMOVAL Left 07/23/2012   Procedure: EXCISION SKIN NEOPLASM LEFT ELBOW;  Surgeon: Jamesetta So, MD;  Location: [facility];  Service: General;  Laterality: Left;  . MYRINGOTOMY WITH TUBE PLACEMENT Left    Dr. Benjamine Mola     OB History   No obstetric history on file.      Home Medications    Prior to Admission medications   Medication Sig Start Date End Date Taking? Authorizing Provider  ALPRAZolam Duanne Moron) 0.25 MG tablet No more than 2 times a day 01/08/18  Yes Eustaquio Maize, MD  Cholecalciferol (VITAMIN D3) 1000 units CAPS Take by mouth.   Yes [provider]  citalopram (CELEXA) 40 MG tablet Take 1 tablet (40 mg total) by mouth daily. 01/08/18  Yes Eustaquio Maize, MD  medroxyPROGESTERone (DEPO-PROVERA) 150 MG/ML injection Inject 1 mL (150 mg total) into the muscle every 3 (three) months. 03/08/17  Yes Eustaquio Maize, MD  Multiple Vitamins-Minerals (MULTIVITAMINS THER. W/MINERALS) TABS Take 1 tablet by mouth daily.   Yes  [provider]  vitamin C (ASCORBIC ACID) 500 MG tablet Take 500 mg by mouth daily.   Yes [provider]    Family History Family History  Problem Relation Age of Onset  . Cancer Maternal Aunt   . Diabetes Maternal Aunt   . Cancer Maternal Aunt     Social History Social History   Tobacco Use  . Smoking status: Current Every Day Smoker    Packs/day: 0.25    Types: Cigarettes  . Smokeless tobacco: Never Used  Substance Use Topics  . Alcohol use: No  . Drug use: No     Allergies   Patient has no known allergies.   Review of Systems Review of Systems  All other systems reviewed and are negative.    Physical Exam Updated Vital  Signs BP 109/67 (BP Location: Left Arm)   Pulse 71   Temp 98.4 F (36.9 C) (Oral)   Resp 15   Ht 1.676 m (5\' 6" )   Wt 65.8 kg   SpO2 100%   BMI 23.40 kg/m   Physical Exam Vitals signs and nursing note reviewed.  Constitutional:      General: She is not in acute distress.    Appearance: She is well-developed.  HENT:     Head: Normocephalic and atraumatic.     Mouth/Throat:     Pharynx: No oropharyngeal exudate.  Eyes:     General: No scleral icterus.       Right eye: No discharge.        Left eye: No discharge.     Conjunctiva/sclera: Conjunctivae normal.     Pupils: Pupils are equal, round, and reactive to light.  Neck:     Musculoskeletal: Normal range of motion and neck supple.     Thyroid: No thyromegaly.     Vascular: No JVD.  Cardiovascular:     Rate and Rhythm: Normal rate and regular rhythm.     Heart sounds: Normal heart sounds. No murmur. No friction rub. No gallop.   Pulmonary:     Effort: Pulmonary effort is normal. No respiratory distress.     Breath sounds: Normal breath sounds. No wheezing or rales.  Abdominal:     General: Bowel sounds are normal. There is no distension.     Palpations: Abdomen is soft. There is no mass.     Tenderness: There is no abdominal tenderness.  Musculoskeletal: Normal range of motion.        General: No tenderness.  Lymphadenopathy:     Cervical: No cervical adenopathy.  Skin:    General: Skin is warm and dry.     Findings: No erythema or rash.  Neurological:     Mental Status: She is alert.     Coordination: Coordination normal.     Comments: The patient has no facial asymmetry, her extraocular movements are intact, there is no ptosis or pupillary asymmetry.  She has normal strength in all 4 extremities, she is able to grip bilaterally and perform finger-nose-finger.  She can lift both legs off the bed with normal strength at 5 out of 5 against resistance and has normal sensation diffusely to light touch except for the  right side of the face which she states is decreased.  Psychiatric:        Behavior: Behavior normal.      ED Treatments / Results  Labs (all labs ordered are listed, but only abnormal results are displayed) Labs Reviewed  COMPREHENSIVE METABOLIC PANEL - Abnormal; Notable  for the following components:      Result Value   AST 13 (*)    All other components within normal limits  ETHANOL  PROTIME-INR  APTT  CBC  DIFFERENTIAL  RAPID URINE DRUG SCREEN, HOSP PERFORMED  URINALYSIS, ROUTINE W REFLEX MICROSCOPIC  I-STAT CHEM 8, ED  I-STAT TROPONIN, ED  I-STAT BETA HCG BLOOD, ED ([facility], [facility], [facility] ONLY)    EKG EKG Interpretation  Date/Time:  Thursday January 09 2018 15:59:59 EST Ventricular Rate:  69 PR Interval:    QRS Duration: 89 QT Interval:  404 QTC Calculation: 433 R Axis:   58 Text Interpretation:  Sinus rhythm Low voltage, precordial leads RSR' in V1 or V2, right VCD or RVH No old tracing to compare Confirmed by Noemi Chapel [phone]) on 01/09/2018 4:03:54 PM   Radiology Ct Head Wo Contrast  Result Date: 01/09/2018 CLINICAL DATA:  History of cavernous sinus meningioma treated with gamma knife. Right-sided weakness and numbness. EXAM: CT HEAD WITHOUT CONTRAST TECHNIQUE: Contiguous axial images were obtained from the base of the skull through the vertex without intravenous contrast. COMPARISON:  CT a head 03/26/2017, MRI head 11/23/2009 FINDINGS: Brain: Ventricle size normal. No acute infarct. Negative for hemorrhage. Left cavernous sinus meningioma not well seen on unenhanced imaging. Dural based enhancing mass adjacent to the left transverse sinus not identified on unenhanced imaging but is noted on the prior CTA. Midline frontal bone meningioma with adjacent intracranial soft tissue mass best seen on postcontrast CTA. Vascular: Negative for hyperdense vessel Skull: Expansile hyperdense mass in the frontal bone in the midline similar to the prior CT. This is most compatible with  meningioma. Intraosseous component measures approximately 27 x 39 mm. Intracranial extension noted on prior enhanced CTA. Sinuses/Orbits: Opacification of the sphenoid sinus with internal calcification similar to the prior CTA. This may be due to treated meningioma. Other: None IMPRESSION: Negative for acute infarct or hemorrhage Multiple meningioma. These are not well seen on unenhanced CT. Tumor follow-up best performed with MRI brain without and with contrast. Electronically Signed   By: Franchot Gallo M.D.   On: 01/09/2018 17:05    Procedures Procedures (including critical care time)  Medications Ordered in ED Medications - No data to display   Initial Impression / Assessment and Plan / ED Course  I have reviewed the triage vital signs and the nursing notes.  Pertinent labs & imaging results that were available during my care of the patient were reviewed by me and considered in my medical decision making (see chart for details).     The patient's neurologic exam has been waxing and waning per her report but at this time has no signs of weakness of her extremities at all.  She has clear speech, normal memory and appears otherwise unremarkable with no sensory loss except for the right side of the face.  I think it is worth doing a CT scan, discussing with neurology and performing other labs including a potassium level as she may have had some hypokalemia causing some of her periodic paralysis or numbness.  Would also consider some B12 involvement.  The patient's laboratory work-up is been totally normal as has the CT scan of the head showed I have having the history of meningiomas.  MRI was requested by radiology given the lack of significant detail on CT scan however the patient adamantly refuses to have this test done because she cannot take the nose ring out of her nose.  She reports it hurts too much  and so she refuses.  I have had a discussion with the patient about the necessity of needing  the MRI to further clarify whether her symptoms are from a stroke, tumor, aneurysms or just the meningiomas.  She is adamant that she not take her nose ring out and refuses to have it done stating that she will go to another hospital to have it done on her own time.  During the course of the patient's visit she had no decompensation in her status, she is still speaking with normal sentences speech patterns and without facial droop and has no focal neurologic deficits on the exam except for the slight right-sided facial numbness.  At this time the patient is leaving [place], she is welcome to return if she changes her mind and is aware of that.  Final Clinical Impressions(s) / ED Diagnoses   Final diagnoses:  Numbness      Noemi Chapel, MD 01/09/18 1806

## 2018-01-09 NOTE — Telephone Encounter (Signed)
Her oncologist recommended she go to hospital for stroke work up.

## 2018-01-09 NOTE — Discharge Instructions (Signed)
Please be aware that the most of your testing has been normal, the CT scan was not able to clear you have any significant complications due to meningiomas, strokes or aneurysms.  For this reason the MRI was ordered.  You have refused the MRI because of your nose ring.  Please be aware that we welcome and encourage you to return at any time should you choose to pursue the rest of the work-up with an MRI.  If you choose to go to another hospital I would suggest that you do that immediately for this further evaluation.

## 2018-01-10 ENCOUNTER — Telehealth: Payer: Self-pay | Admitting: Pediatrics

## 2018-01-10 NOTE — Telephone Encounter (Signed)
PT went to ER at [facility] yesterday for 4 years, they told her that she was perfectly healthy and that she was discharged from the ER   Only thing that she didn't have done yesterday was an MRI because she wouldn't take out her nose ring. States that she has had that nose ring in since 2000's and that it "lives in her nose" and that she will have to go to [place] or somewhere to get the MRI done.

## 2018-01-10 NOTE — Telephone Encounter (Signed)
Please review and advise.

## 2018-01-16 ENCOUNTER — Ambulatory Visit (INDEPENDENT_AMBULATORY_CARE_PROVIDER_SITE_OTHER): Payer: Medicare Other | Admitting: Neurology

## 2018-01-16 ENCOUNTER — Encounter: Payer: Self-pay | Admitting: Neurology

## 2018-01-16 VITALS — BP 118/83 | HR 76 | Ht 66.0 in | Wt 146.0 lb

## 2018-01-16 DIAGNOSIS — R200 Anesthesia of skin: Secondary | ICD-10-CM | POA: Diagnosis not present

## 2018-01-16 DIAGNOSIS — R531 Weakness: Secondary | ICD-10-CM | POA: Diagnosis not present

## 2018-01-16 NOTE — Patient Instructions (Addendum)
You feel like you have had progressive weakness since before thanksgiving. You have been to the ER for this.  Should you have any sudden onset of one-sided weakness, numbness, tingling, shortness of breath, chest pain, one-sided headache or severe headache, changes in mental state, please proceed to the ER or call 911 immediately. I would recommend you get in touch with Dr. Vallarie Mare about getting a repeat brain MRI done, as you may have a new or regrowth of your meningioma(s). You have had lab work and a CT head recently, which did not show any acute findings thankfully.  I would recommend you follow up with your doctors at [facility], since you have had your care there and they are familiar with your history, you have had several MRIs there and they would be best to compare findings.

## 2018-01-16 NOTE — Progress Notes (Signed)
Subjective:    Patient ID: Sheena Willis is a 48 y.o. female.  HPI     Star Age, MD, PhD [facility] [address] [place], [place] [address]  Dear Dr. Evette Doffing,  I saw your patient, Sheena Willis, upon your kind request in my neurologic clinic today for initial consultation of an episode of right-sided numbness that happened in December. The patient is unaccompanied today. As you know, Sheena Willis is a 48 year old right-handed woman with an underlying medical history of multiple meningiomas including most recently a right orbital meningioma for which she has been under the care of Dr. Vallarie Mare at [facility], radiation oncology, treated with gamma knife, who reports intermittent numbness affecting the right side of her body. She reports that symptoms initially started before Thanksgiving, she was in [place] visiting her daughter at the time. She did not go to the emergency room at the time. She had transient symptoms at the time and started having symptoms of right-sided numbness and weakness around Christmas day or day after Christmas.  She presented to the emergency room on 01/09/2018 with intermittent numbness affecting the right face and leg and some weakness reported also in the right leg. Her head CT without contrast was negative but she was advised to proceed with a brain MRI to further investigate her symptoms. She declined the MRI and was reported in the ER records to have left AMA. She had a head CT without contrast on 01/09/2018 and I reviewed the results: IMPRESSION: Negative for acute infarct or hemorrhage.   Multiple meningioma. These are not well seen on unenhanced CT. Tumor follow-up best performed with MRI brain without and with contrast.   Of note, over the past several years she has had multiple brain MRIs, as far back as 2012, all done through [facility] as I can see.   Her MRI from 09/04/2017 showed: 1.   Slow increase in size of right orbital/middle cranial fossa dural based mass over the past several exams which results in mass effect on the right optic nerve, as described above. This is most consistent with an additional meningioma. Degree of mass effect and potential abnormal signal of the right optic nerve could be better evaluated on dedicated contrast enhanced MRI of the orbits. 2.  Similar size and appearance of the large treated meningioma centered about the left sphenoid wing but with extensive involvement of the skull base, cavernous sinus, and left masticator space, as described above. There is similar encasement of and high-grade stenosis of the cavernous portion of the left carotid artery.  3.  Similar size and appearance of the superior anterior parafalcine meningiomas compared to 02/04/2017. 4.  Asymmetric dural thickening of the cranial margin of the left transverse sinus which could represent an additional meningioma. No evidence of venous sinus thrombosis, though the heterogeneous signal in this sinus on T2/FLAIR suggests slow flow. Recommend attention on follow-up imaging.  She had a sleep deprived EEG at [facility] on 05/24/2012 and I reviewed the results:  EEG Interpretation:  Normal awake and asleep routine EEG.   She had seen Dr. Arlan Organ in neurosurgery at [facility] and also neurology through [facility] including Dr. Carter Kitten, on 05/08/2012 at which time she reported episodic numbness affecting right more than left upper extremities.  She feels, like she has progressive weakness, affecting her R arm and leg. She has not had her MRI as recommended by Dr. Vallarie Mare. She is planning to  go to [facility] for this. She has been in touch with Dr. Vallarie Mare as I understand this week or last week. He did recommend that she proceed with an MRI, but she has not had it yet and she would rather go to [facility] for this. She states that she cannot have the MRI because of her nose piercing.   Her Past Medical  History Is Significant For: Past Medical History:  Diagnosis Date  . Brain tumor (benign) ([place])   . Radiation    for brain tumors  . Vertigo    due to benign brain tumor    Her Past Surgical History Is Significant For: Past Surgical History:  Procedure Laterality Date  . BRAIN BIOPSY  12/05/2009  . HALO APPLICATION     done before gamma knife radio surgery  . LESION REMOVAL Left 07/23/2012   Procedure: EXCISION SKIN NEOPLASM LEFT ELBOW;  Surgeon: Jamesetta So, MD;  Location: [facility];  Service: General;  Laterality: Left;  . MYRINGOTOMY WITH TUBE PLACEMENT Left    Dr. Benjamine Mola    Her Family History Is Significant For: Family History  Problem Relation Age of Onset  . Cancer Maternal Aunt   . Diabetes Maternal Aunt   . Cancer Maternal Aunt     Her Social History Is Significant For: Social History   Socioeconomic History  . Marital status: Divorced    Spouse name: Not on file  . Number of children: Not on file  . Years of education: Not on file  . Highest education level: Not on file  Occupational History  . Not on file  Social Needs  . Financial resource strain: Not on file  . Food insecurity:    Worry: Not on file    Inability: Not on file  . Transportation needs:    Medical: Not on file    Non-medical: Not on file  Tobacco Use  . Smoking status: Current Every Day Smoker    Packs/day: 0.25    Types: Cigarettes  . Smokeless tobacco: Never Used  Substance and Sexual Activity  . Alcohol use: No  . Drug use: No  . Sexual activity: Never  Lifestyle  . Physical activity:    Days per week: Not on file    Minutes per session: Not on file  . Stress: Not on file  Relationships  . Social connections:    Talks on phone: Not on file    Gets together: Not on file    Attends religious service: Not on file    Active member of club or organization: Not on file    Attends meetings of clubs or organizations: Not on file    Relationship status: Not on file  Other Topics  Concern  . Not on file  Social History Narrative  . Not on file    Her Allergies Are:  No Known Allergies:   Her Current Medications Are:  Outpatient Encounter Medications as of 01/16/2018  Medication Sig  . ALPRAZolam (XANAX) 0.25 MG tablet No more than 2 times a day  . Cholecalciferol (VITAMIN D3) 1000 units CAPS Take by mouth.  . citalopram (CELEXA) 40 MG tablet Take 1 tablet (40 mg total) by mouth daily.  . medroxyPROGESTERone (DEPO-PROVERA) 150 MG/ML injection Inject 1 mL (150 mg total) into the muscle every 3 (three) months.  . Multiple Vitamins-Minerals (MULTIVITAMINS THER. W/MINERALS) TABS Take 1 tablet by mouth daily.  . vitamin C (ASCORBIC ACID) 500 MG tablet Take 500 mg by mouth daily.  No facility-administered encounter medications on file as of 01/16/2018.   : Review of Systems:  Out of a complete 14 point review of systems, all are reviewed and negative with the exception of these symptoms as listed below: Review of Systems  Neurological:       Pt presents today to discuss her numbness. Pt has right sided weakness that has been progressively worse since Christmas. She is having a hard time writing and cleaning herself. She is right handed. She will not take out her nose piercing to have an MRI.    Objective:  Neurological Exam  Physical Exam Physical Examination:   Vitals:   01/16/18 1404  BP: 118/83  Pulse: 76    General Examination: The patient is a very pleasant 48 y.o. female in no acute distress. She appears well-developed and well-nourished and well groomed.   HEENT: Normocephalic, atraumatic, pupils are equal, round and reactive to light and accommodation. Funduscopic exam is normal with sharp disc margins noted. She has bifocal glasses. Extraocular tracking is good without limitation to gaze excursion or nystagmus noted. Normal smooth pursuit is noted. Hearing is grossly intact. Face is symmetric with normal facial animation and normal facial sensation.  Speech is clear with no dysarthria noted. There is no hypophonia. There is no lip, neck/head, jaw or voice tremor. Neck is supple with full range of passive and active motion. There are no carotid bruits on auscultation. Oropharynx exam reveals: moderate mouth dryness, adequate dental hygiene. Tongue protrudes centrally and palate elevates symmetrically.   Chest: Clear to auscultation without wheezing, rhonchi or crackles noted.  Heart: S1+S2+0, regular and normal without murmurs, rubs or gallops noted.   Abdomen: Soft, non-tender and non-distended with normal bowel sounds appreciated on auscultation.  Extremities: There is no pitting edema in the distal lower extremities bilaterally.  Skin: Warm and dry without trophic changes noted.  Musculoskeletal: exam reveals no obvious joint deformities, tenderness or joint swelling or erythema.   Neurologically:  Mental status: The patient is awake, alert and oriented in all 4 spheres. Her immediate and remote memory, attention, language skills and fund of knowledge are appropriate. There is no evidence of aphasia, agnosia, apraxia or anomia. Speech is clear with normal prosody and enunciation. Thought process is linear. Mood is normal and affect is blunted.  Cranial nerves II - XII are as described above under HEENT exam. In addition: shoulder shrug is normal with equal shoulder height noted. Motor exam: thin bulk, normal tone, variable strength on the right and left with some giveaway weakness in the right hip flexor but variable strength noted bilaterally. Global strength of 4+ out of 5 noted. There is no drift, tremor or rebound. Romberg is negative. Reflexes are 2+ throughout. Babinski: Toes are flexor bilaterally. Fine motor skills and coordination: intact with normal finger taps, normal hand movements, normal rapid alternating patting, normal foot taps and normal foot agility.  Cerebellar testing: No dysmetria or intention tremor on finger to nose  testing. Heel to shin is unremarkable bilaterally. There is no truncal or gait ataxia.  Sensory exam: intact to light touch, pinprick, vibration, temperature sense in the upper and lower extremities, without telltale difference between the right and left hemibody.  Gait, station and balance: She stands without difficulty and is able to stand narrow based. Her walk is unremarkable with preserved arm swing, no dragging of either leg, no steppage, no shuffling. Tandem walk is difficult for her.  Assessment and Plan:   In summary, LATICE WAITMAN  is a very pleasant 48 y.o.-year old female with an underlying medical history of multiple meningiomas including most recently a right orbital meningioma for which she has been under the care of Dr. Vallarie Mare at [facility], radiation oncology, treated with gamma knife, who presents for evaluation of intermittent numbness and weakness affecting the right hemibody. She has a long-standing history of intermittent symptoms including weakness and numbness, and has been treated repeatedly and for the past several years for multiple meningiomas. She is under the care of radiation oncology at [facility] but has also seen neurology and neurosurgery there. I would favor that she keep her care consolidated at [facility] as she has had her previous scans there for comparison. I would favor that her radiation oncologist order the MRI and they have been in touch with him last week. As I understand he did indicate that she could have her MRI at [facility]. Will copy him on my note today. Thankfully, she had acute workup at the hospital recently including labs and a head CT which did not show any acute findings. At this juncture, I suggested that she proceed with an MRI and follow-up with her providers at [facility] as they are familiar with her case. She and her son are in agreement. She was advised to proceed with an MRI when she presented to the emergency room on 01/09/2018 but  declined it at the time. She is advised, should she have any acute exacerbation of symptoms, or sudden onset of headaches, blurry vision, or chest pain or shortness of breath that she would have to call 911 or proceed to the nearest emergency room. She does report that her glasses are not as good any longer and she does not recall when she had her last formal eye examination. She is encouraged to follow-up with her eye doctor as well.  Thank you very much for allowing me to participate in the care of this nice patient. If I can be of any further assistance to you please do not hesitate to call me at [phone].  Sincerely,   Star Age, MD, PhD

## 2018-01-17 DIAGNOSIS — Z86011 Personal history of benign neoplasm of the brain: Secondary | ICD-10-CM | POA: Diagnosis not present

## 2018-01-17 DIAGNOSIS — Z8042 Family history of malignant neoplasm of prostate: Secondary | ICD-10-CM | POA: Diagnosis not present

## 2018-01-17 DIAGNOSIS — E785 Hyperlipidemia, unspecified: Secondary | ICD-10-CM | POA: Diagnosis not present

## 2018-01-17 DIAGNOSIS — M6281 Muscle weakness (generalized): Secondary | ICD-10-CM | POA: Diagnosis not present

## 2018-01-17 DIAGNOSIS — I639 Cerebral infarction, unspecified: Secondary | ICD-10-CM | POA: Diagnosis not present

## 2018-01-17 DIAGNOSIS — R29703 NIHSS score 3: Secondary | ICD-10-CM | POA: Diagnosis not present

## 2018-01-17 DIAGNOSIS — R531 Weakness: Secondary | ICD-10-CM | POA: Diagnosis not present

## 2018-01-17 DIAGNOSIS — I63232 Cerebral infarction due to unspecified occlusion or stenosis of left carotid arteries: Secondary | ICD-10-CM | POA: Diagnosis not present

## 2018-01-17 DIAGNOSIS — I6389 Other cerebral infarction: Secondary | ICD-10-CM | POA: Diagnosis not present

## 2018-01-17 DIAGNOSIS — R200 Anesthesia of skin: Secondary | ICD-10-CM | POA: Diagnosis not present

## 2018-01-17 DIAGNOSIS — I69351 Hemiplegia and hemiparesis following cerebral infarction affecting right dominant side: Secondary | ICD-10-CM | POA: Diagnosis not present

## 2018-01-18 DIAGNOSIS — Z923 Personal history of irradiation: Secondary | ICD-10-CM | POA: Diagnosis not present

## 2018-01-18 DIAGNOSIS — E119 Type 2 diabetes mellitus without complications: Secondary | ICD-10-CM | POA: Diagnosis present

## 2018-01-18 DIAGNOSIS — I63232 Cerebral infarction due to unspecified occlusion or stenosis of left carotid arteries: Secondary | ICD-10-CM | POA: Diagnosis present

## 2018-01-18 DIAGNOSIS — I63513 Cerebral infarction due to unspecified occlusion or stenosis of bilateral middle cerebral arteries: Secondary | ICD-10-CM | POA: Diagnosis not present

## 2018-01-18 DIAGNOSIS — I639 Cerebral infarction, unspecified: Secondary | ICD-10-CM | POA: Diagnosis not present

## 2018-01-18 DIAGNOSIS — Z86011 Personal history of benign neoplasm of the brain: Secondary | ICD-10-CM | POA: Diagnosis not present

## 2018-01-18 DIAGNOSIS — R29703 NIHSS score 3: Secondary | ICD-10-CM | POA: Diagnosis present

## 2018-01-18 DIAGNOSIS — I6522 Occlusion and stenosis of left carotid artery: Secondary | ICD-10-CM | POA: Diagnosis not present

## 2018-01-18 DIAGNOSIS — I10 Essential (primary) hypertension: Secondary | ICD-10-CM | POA: Diagnosis present

## 2018-01-18 DIAGNOSIS — D329 Benign neoplasm of meninges, unspecified: Secondary | ICD-10-CM | POA: Diagnosis present

## 2018-01-18 DIAGNOSIS — R531 Weakness: Secondary | ICD-10-CM | POA: Diagnosis not present

## 2018-01-18 DIAGNOSIS — Z87891 Personal history of nicotine dependence: Secondary | ICD-10-CM | POA: Diagnosis not present

## 2018-01-18 DIAGNOSIS — I63512 Cerebral infarction due to unspecified occlusion or stenosis of left middle cerebral artery: Secondary | ICD-10-CM | POA: Diagnosis not present

## 2018-01-18 DIAGNOSIS — R200 Anesthesia of skin: Secondary | ICD-10-CM | POA: Diagnosis not present

## 2018-01-18 DIAGNOSIS — F329 Major depressive disorder, single episode, unspecified: Secondary | ICD-10-CM | POA: Diagnosis present

## 2018-01-18 DIAGNOSIS — Z8042 Family history of malignant neoplasm of prostate: Secondary | ICD-10-CM | POA: Diagnosis not present

## 2018-01-18 DIAGNOSIS — I63523 Cerebral infarction due to unspecified occlusion or stenosis of bilateral anterior cerebral arteries: Secondary | ICD-10-CM | POA: Diagnosis not present

## 2018-01-18 DIAGNOSIS — E785 Hyperlipidemia, unspecified: Secondary | ICD-10-CM | POA: Diagnosis not present

## 2018-01-18 DIAGNOSIS — D320 Benign neoplasm of cerebral meninges: Secondary | ICD-10-CM | POA: Diagnosis not present

## 2018-01-18 DIAGNOSIS — I63522 Cerebral infarction due to unspecified occlusion or stenosis of left anterior cerebral artery: Secondary | ICD-10-CM | POA: Diagnosis not present

## 2018-01-18 DIAGNOSIS — I69351 Hemiplegia and hemiparesis following cerebral infarction affecting right dominant side: Secondary | ICD-10-CM | POA: Diagnosis not present

## 2018-01-18 DIAGNOSIS — M6281 Muscle weakness (generalized): Secondary | ICD-10-CM | POA: Diagnosis not present

## 2018-01-20 DIAGNOSIS — I63512 Cerebral infarction due to unspecified occlusion or stenosis of left middle cerebral artery: Secondary | ICD-10-CM | POA: Insufficient documentation

## 2018-01-22 ENCOUNTER — Telehealth: Payer: Self-pay | Admitting: Pediatrics

## 2018-01-22 NOTE — Telephone Encounter (Signed)
Patient has a follow up appointment scheduled. 

## 2018-01-24 ENCOUNTER — Encounter: Payer: Self-pay | Admitting: Physician Assistant

## 2018-01-24 ENCOUNTER — Ambulatory Visit (INDEPENDENT_AMBULATORY_CARE_PROVIDER_SITE_OTHER): Payer: Medicare Other | Admitting: Physician Assistant

## 2018-01-24 VITALS — BP 107/71 | HR 72 | Temp 98.0°F | Ht 66.0 in | Wt 141.2 lb

## 2018-01-24 DIAGNOSIS — R079 Chest pain, unspecified: Secondary | ICD-10-CM

## 2018-01-24 DIAGNOSIS — I6930 Unspecified sequelae of cerebral infarction: Secondary | ICD-10-CM | POA: Diagnosis not present

## 2018-01-24 DIAGNOSIS — I63512 Cerebral infarction due to unspecified occlusion or stenosis of left middle cerebral artery: Secondary | ICD-10-CM

## 2018-01-24 DIAGNOSIS — D329 Benign neoplasm of meninges, unspecified: Secondary | ICD-10-CM | POA: Diagnosis not present

## 2018-01-24 DIAGNOSIS — Z029 Encounter for administrative examinations, unspecified: Secondary | ICD-10-CM

## 2018-01-24 NOTE — Patient Instructions (Addendum)
In a few days you may receive a survey in the mail or online from [employer] regarding your visit with [place] today. Please take a moment to fill this out. Your feedback is very important to our whole office. It can help [place] better understand your needs as well as improve your experience and satisfaction. Thank you for taking your time to complete it. We care about you.  Particia Nearing, PA-C   Preventing High Cholesterol Cholesterol is a waxy, fat-like substance that your body needs in small amounts. Your liver makes all the cholesterol that your body needs. Having high cholesterol (hypercholesterolemia) increases your risk for heart disease and stroke. Extra (excess) cholesterol comes from the food you eat, such as animal-based fat (saturated fat) from meat and some dairy products. High cholesterol can often be prevented with diet and lifestyle changes. If you already have high cholesterol, you can control it with diet and lifestyle changes, as well as medicine. What nutrition changes can be made?  Eat less saturated fat. Foods that contain saturated fat include red meat and some dairy products.  Avoid processed meats, like bacon and lunch meats.  Avoid trans fats, which are found in margarine and some baked goods.  Avoid foods and beverages that have added sugars.  Eat more fruits, vegetables, and whole grains.  Choose healthy sources of protein, such as fish, poultry, and nuts.  Choose healthy sources of fat, such as: ? Nuts. ? Vegetable oils, especially olive oil. ? Fish that have healthy fats (omega-3 fatty acids), such as mackerel or salmon. What lifestyle changes can be made?   Lose weight if you are overweight. Losing 5-10 lb (2.3-4.5 kg) can help prevent or control high cholesterol and reduce your risk for diabetes and high blood pressure. Ask your health care provider to help you with a diet and exercise plan to safely lose weight.  Get enough exercise. Do at least 150 minutes of  moderate-intensity exercise each week. ? You could do this in short exercise sessions several times a day, or you could do longer exercise sessions a few times a week. For example, you could take a brisk 10-minute walk or bike ride, 3 times a day, for 5 days a week.  Do not smoke. If you need help quitting, ask your health care provider.  Limit your alcohol intake. If you drink alcohol, limit alcohol intake to no more than 1 drink a day for nonpregnant women and 2 drinks a day for men. One drink equals 12 oz of beer, 5 oz of wine, or 1 oz of hard liquor. Why are these changes important?  If you have high cholesterol, deposits (plaques) may build up on the walls of your blood vessels. Plaques make the arteries narrower and stiffer, which can restrict or block blood flow and cause blood clots to form. This greatly increases your risk for heart attack and stroke. Making diet and lifestyle changes can reduce your risk for these life-threatening conditions. What can I do to lower my risk?  Manage your risk factors for high cholesterol. Talk with your health care provider about all of your risk factors and how to lower your risk.  Manage other conditions that you have, such as diabetes or high blood pressure (hypertension).  Have your cholesterol checked at regular intervals.  Keep all follow-up visits as told by your health care provider. This is important. How is this treated? In addition to diet and lifestyle changes, your health care provider may recommend medicines  to help lower cholesterol, such as a medicine to reduce the amount of cholesterol made in your liver. You may need medicine if:  Diet and lifestyle changes do not lower your cholesterol enough.  You have high cholesterol and other risk factors for heart disease or stroke. Take over-the-counter and prescription medicines only as told by your health care provider. Where to find more information  American Heart Association:  ThisTune.com.pt.jsp  National Heart, Lung, and Blood Institute: FrenchToiletries.com.cy Summary  High cholesterol increases your risk for heart disease and stroke. By keeping your cholesterol level low, you can reduce your risk for these conditions.  Diet and lifestyle changes are the most important steps in preventing high cholesterol.  Work with your health care provider to manage your risk factors, and have your blood tested regularly. This information is not intended to replace advice given to you by your health care provider. Make sure you discuss any questions you have with your health care provider. Document Released: 01/02/2015 Document Revised: 08/27/2015 Document Reviewed: 08/27/2015 Elsevier Interactive Patient Education  2019 [employer].

## 2018-01-27 NOTE — Progress Notes (Signed)
BP 107/71   Pulse 72   Temp 98 F (36.7 C) (Oral)   Ht 5\' 6"  (1.676 m)   Wt 141 lb 3.2 oz (64 kg)   BMI 22.79 kg/m    Subjective:    Patient ID: Sheena Willis, female    DOB: [birth year], 48 y.o.   MRN: [id number]  HPI: Sheena Willis is a 48 y.o. female presenting on 01/24/2018 for Hospitalization Follow-up  This patient comes in for periodic recheck on medications and conditions including recurrent meningioma, espisode chest pain, CVA. She was admitted for CVA 1/18-1/22/2020. She was found to have left middle carotid artery stenosis. She experienced right sided numbness.  She has quit all alcohol, and really wants to get her cholesterol under good control.   Her ejection fraction on echocardiogram was 60-65% and no evidence of thrombus.  She will be due for mammogram in a couple of months.  Also would like nutritional information for her cholesterol control. Will make an appointment with lipid specialist Memory Argue in our office. Her son Carlyle Lipa will be her main transportation and caregiver.    All records from [facility] were reviewed.    All medications are reviewed today. There are no reports of any problems with the medications. All of the medical conditions are reviewed and updated.  Lab work is reviewed and will be ordered as medically necessary. There are no new problems reported with today's visit.   Past Medical History:  Diagnosis Date  . Brain tumor (benign) ([place])   . Radiation    for brain tumors  . Vertigo    due to benign brain tumor   Relevant past medical, surgical, family and social history reviewed and updated as indicated. Interim medical history since our last visit reviewed. Allergies and medications reviewed and updated. DATA REVIEWED: CHART IN EPIC  Family History reviewed for pertinent findings.  Review of Systems  Constitutional: Negative.  Negative for activity change, fatigue and fever.  HENT: Negative.   Eyes: Negative.     Respiratory: Negative.  Negative for cough, shortness of breath and wheezing.   Cardiovascular: Positive for palpitations. Negative for chest pain.  Gastrointestinal: Negative.  Negative for abdominal pain.  Endocrine: Negative.   Genitourinary: Negative.  Negative for dysuria.  Musculoskeletal: Positive for arthralgias and back pain.  Skin: Negative.   Neurological: Positive for weakness and headaches.    Allergies as of 01/24/2018   No Known Allergies     Medication List       Accurate as of January 24, 2018 11:59 PM. Always use your most recent med list.        ALPRAZolam 0.25 MG tablet Commonly known as:  XANAX No more than 2 times a day   aspirin EC 81 MG tablet Take by mouth.   atorvastatin 40 MG tablet Commonly known as:  LIPITOR Take by mouth.   citalopram 40 MG tablet Commonly known as:  CELEXA Take 1 tablet (40 mg total) by mouth daily.   clopidogrel 75 MG tablet Commonly known as:  PLAVIX Take by mouth.   medroxyPROGESTERone 150 MG/ML injection Commonly known as:  DEPO-PROVERA Inject 1 mL (150 mg total) into the muscle every 3 (three) months.   multivitamins ther. w/minerals Tabs tablet Take 1 tablet by mouth daily.   vitamin C 500 MG tablet Commonly known as:  ASCORBIC ACID Take 500 mg by mouth daily.   Vitamin D3 25 MCG (1000 UT) Caps Take by mouth.  Objective:    BP 107/71   Pulse 72   Temp 98 F (36.7 C) (Oral)   Ht 5\' 6"  (1.676 m)   Wt 141 lb 3.2 oz (64 kg)   BMI 22.79 kg/m   No Known Allergies  Wt Readings from Last 3 Encounters:  01/24/18 141 lb 3.2 oz (64 kg)  01/16/18 146 lb (66.2 kg)  01/09/18 145 lb (65.8 kg)    Physical Exam Constitutional:      Appearance: She is well-developed.  HENT:     Head: Normocephalic and atraumatic.     Right Ear: Tympanic membrane, ear canal and external ear normal.     Left Ear: Tympanic membrane, ear canal and external ear normal.     Nose: Nose normal. No rhinorrhea.      Mouth/Throat:     Pharynx: No oropharyngeal exudate or posterior oropharyngeal erythema.  Eyes:     Conjunctiva/sclera: Conjunctivae normal.     Pupils: Pupils are equal, round, and reactive to light.  Neck:     Musculoskeletal: Normal range of motion and neck supple.  Cardiovascular:     Rate and Rhythm: Normal rate and regular rhythm.     Heart sounds: Normal heart sounds.  Pulmonary:     Effort: Pulmonary effort is normal.     Breath sounds: Normal breath sounds.  Abdominal:     General: Bowel sounds are normal.     Palpations: Abdomen is soft.  Skin:    General: Skin is warm and dry.     Findings: No rash.  Neurological:     Mental Status: She is alert and oriented to person, place, and time.     Motor: Weakness present.     Deep Tendon Reflexes: Reflexes are normal and symmetric.  Psychiatric:        Behavior: Behavior normal.        Thought Content: Thought content normal.        Judgment: Judgment normal.     Results for orders placed or performed during the hospital encounter of 01/09/18  Ethanol  Result Value Ref Range   Alcohol, Ethyl (B) <10 <10 mg/dL  Protime-INR  Result Value Ref Range   Prothrombin Time 12.8 11.4 - 15.2 seconds   INR 0.97   APTT  Result Value Ref Range   aPTT 30 24 - 36 seconds  CBC  Result Value Ref Range   WBC 8.5 4.0 - 10.5 K/uL   RBC 4.81 3.87 - 5.11 MIL/uL   Hemoglobin 13.5 12.0 - 15.0 g/dL   HCT 42.2 36.0 - 46.0 %   MCV 87.7 80.0 - 100.0 fL   MCH 28.1 26.0 - 34.0 pg   MCHC 32.0 30.0 - 36.0 g/dL   RDW 14.2 11.5 - 15.5 %   Platelets 285 150 - 400 K/uL   nRBC 0.0 0.0 - 0.2 %  Differential  Result Value Ref Range   Neutrophils Relative % 55 %   Neutro Abs 4.7 1.7 - 7.7 K/uL   Lymphocytes Relative 34 %   Lymphs Abs 2.9 0.7 - 4.0 K/uL   Monocytes Relative 7 %   Monocytes Absolute 0.6 0.1 - 1.0 K/uL   Eosinophils Relative 3 %   Eosinophils Absolute 0.3 0.0 - 0.5 K/uL   Basophils Relative 1 %   Basophils Absolute 0.1 0.0 -  0.1 K/uL   Immature Granulocytes 0 %   Abs Immature Granulocytes 0.03 0.00 - 0.07 K/uL  Comprehensive metabolic panel  Result Value Ref  Range   Sodium 137 135 - 145 mmol/L   Potassium 3.5 3.5 - 5.1 mmol/L   Chloride 106 98 - 111 mmol/L   CO2 25 22 - 32 mmol/L   Glucose, Bld 82 70 - 99 mg/dL   BUN 9 6 - 20 mg/dL   Creatinine, Ser 0.94 0.44 - 1.00 mg/dL   Calcium 9.1 8.9 - 10.3 mg/dL   Total Protein 7.2 6.5 - 8.1 g/dL   Albumin 4.3 3.5 - 5.0 g/dL   AST 13 (L) 15 - 41 U/L   ALT 13 0 - 44 U/L   Alkaline Phosphatase 62 38 - 126 U/L   Total Bilirubin 0.5 0.3 - 1.2 mg/dL   GFR calc non Af Amer >60 >60 mL/min   GFR calc Af Amer >60 >60 mL/min   Anion gap 6 5 - 15  I-Stat Chem 8, ED  Result Value Ref Range   Sodium 140 135 - 145 mmol/L   Potassium 3.6 3.5 - 5.1 mmol/L   Chloride 102 98 - 111 mmol/L   BUN 7 6 - 20 mg/dL   Creatinine, Ser 1.00 0.44 - 1.00 mg/dL   Glucose, Bld 81 70 - 99 mg/dL   Calcium, Ion 1.24 1.15 - 1.40 mmol/L   TCO2 25 22 - 32 mmol/L   Hemoglobin 15.0 12.0 - 15.0 g/dL   HCT 44.0 36.0 - 46.0 %  I-stat troponin, ED  Result Value Ref Range   Troponin i, poc 0.00 0.00 - 0.08 ng/mL   Comment 3          I-Stat beta hCG blood, ED  Result Value Ref Range   I-stat hCG, quantitative <5.0 <5 mIU/mL   Comment 3              Assessment & Plan:   1. Chest pain, unspecified type - EKG 12-Lead Normal findings  2. Cerebrovascular accident (CVA) due to stenosis of left middle cerebral artery (HCC) - atorvastatin (LIPITOR) 40 MG tablet; Take by mouth. - clopidogrel (PLAVIX) 75 MG tablet; Take by mouth. - aspirin EC 81 MG tablet; Take by mouth.  3. Meningioma ([place]) Continue therapy through [facility] neurosurgery   Continue all other maintenance medications as listed above.  Follow up plan: Return in about 6 months (around 07/25/2018).  Educational handout given for [place] PA-C [place] [address]    [place], [place] [address] [phone]   01/27/2018, 8:29 PM

## 2018-01-29 ENCOUNTER — Encounter: Payer: Self-pay | Admitting: Physical Therapy

## 2018-01-29 ENCOUNTER — Other Ambulatory Visit: Payer: Self-pay | Admitting: Physician Assistant

## 2018-01-29 ENCOUNTER — Ambulatory Visit: Payer: Medicare Other | Attending: Neurology | Admitting: Physical Therapy

## 2018-01-29 DIAGNOSIS — M6281 Muscle weakness (generalized): Secondary | ICD-10-CM | POA: Diagnosis not present

## 2018-01-29 DIAGNOSIS — I63512 Cerebral infarction due to unspecified occlusion or stenosis of left middle cerebral artery: Secondary | ICD-10-CM

## 2018-01-29 NOTE — Therapy (Signed)
Sheena Willis, [place], [address] Phone: [phone]   Fax:  [phone]  Physical Therapy Evaluation  Patient Details  Name: Sheena Willis MRN: [id number] Date of Birth: [birth year] Referring Provider (PT): Dr. Ala Bent   Encounter Date: 01/29/2018  PT End of Session - 01/29/18 1603    Visit Number  1    Number of Visits  12    Date for PT Re-Evaluation  03/19/18    PT Start Time  2671    PT Stop Time  1430    PT Time Calculation (min)  45 min    Activity Tolerance  Patient tolerated treatment well    Behavior During Therapy  [facility] for tasks assessed/performed       Past Medical History:  Diagnosis Date  . Brain tumor (benign) (Sheena Willis)   . Radiation    for brain tumors  . Vertigo    due to benign brain tumor    Past Surgical History:  Procedure Laterality Date  . BRAIN BIOPSY  12/05/2009  . HALO APPLICATION     done before gamma knife radio surgery  . LESION REMOVAL Left 07/23/2012   Procedure: EXCISION SKIN NEOPLASM LEFT ELBOW;  Surgeon: Sheena So, MD;  Location: [facility];  Service: General;  Laterality: Left;  . MYRINGOTOMY WITH TUBE PLACEMENT Left    Dr. Benjamine Willis    There were no vitals filed for this visit.   Subjective Assessment - 01/29/18 1601    Subjective  Patient arrives to physical therapy with reports of right sided weakness UE>LE that began after having two strokes on December 25 and December 26, 2017. Patient reports weakness and difficulties with daily activities such as brushing her teeth, doing her hair, and toileting tasks. Patient noted an improvement in strength and ability to perform tasks, but patient reports she still has difficulties with coordinating the movements. Patient denies pain but has ongoing numbness in her right torso down to her right ankle; patient denies numbness in R UE. Patient has been compliant with doctor's orders for stroke prevention. Patient reports intermittent  assistance from her son for cooking or cleaning. Patient's goals are to improve ability to perform home tasks and have the coordination to braid her granddaughter's hair.     Pertinent History  History of strokes, 5 brain tumors (benign)    Limitations  House hold activities    Diagnostic tests  MRI: infarcts in left ACA/MCA (per ED documentation 01/18/2018)    Patient Stated Goals  "get back to as close to normal, be able to braid hair"    Currently in Pain?  No/denies         [facility] PT Assessment - 01/29/18 0001      Assessment   Medical Diagnosis  CVA due to stenosis of left middle cerebral artery    Referring Provider (PT)  Dr. Ala Bent    Onset Date/Surgical Date  12/25/17    Hand Dominance  Right    Next MD Visit  02/04/2018    Prior Therapy  no      Precautions   Precautions  None      Restrictions   Weight Bearing Restrictions  No      Balance Screen   Has the patient fallen in the past 6 months  No    Has the patient had a decrease in activity level because of a fear of falling?   No    Is the patient reluctant  to leave their home because of a fear of falling?   No      Home Film/video editor residence    Living Arrangements  Children      Prior Function   Level of Independence  Independent with basic ADLs      Sensation   Light Touch  Impaired by gross assessment   right side of trunk and right LE     ROM / Strength   AROM / PROM / Strength  AROM;Strength      AROM   Overall AROM Comments  right shoulder AROM WFL      Strength   Strength Assessment Site  Shoulder;Hip;Knee;Hand    Right/Left Shoulder  Right    Right Shoulder Flexion  4-/5    Right Shoulder ABduction  4-/5    Right Shoulder Internal Rotation  4-/5    Right Shoulder External Rotation  4-/5    Right/Left hand  Right;Left    Right Hand Grip (lbs)  35 lbs    Left Hand Grip (lbs)  35 lbs    Right/Left Hip  Right    Right Hip Flexion  3+/5    Right/Left Knee  Right     Right Knee Flexion  4-/5    Right Knee Extension  4-/5      Transfers   Comments  able to perform transitions safely with minimal use of UEs      Ambulation/Gait   Gait Pattern  Step-through pattern;Decreased stride length;Ataxic   slight ataxia observed               Objective measurements completed on examination: See above findings.                   PT Long Term Goals - 01/29/18 1550      PT LONG TERM GOAL #1   Title  Patient will be independent with HEP     Time  6    Period  Weeks    Status  New      PT LONG TERM GOAL #2   Title  Patient will demonstrate 4+/5 or greater right shoulder MMT in all planes to improve stability during functional tasks.    Time  6    Period  Weeks    Status  New      PT LONG TERM GOAL #3   Title  Patient will demonstrate 4+/5 or greater right LE MMT in all planes to improve stability during functional tasks.     Time  6    Period  Weeks    Status  New      PT LONG TERM GOAL #4   Title  Patient will report ability to perform functional tasks, toileting tasks, and ADLs independently.    Time  6    Period  Weeks    Status  New             Plan - 01/29/18 1548    Clinical Impression Statement  Patient is a 48 year old female who presents to physical therapy with right sided weakness due to a left ACA/MCA CVA on December 25th and 26th 2019. Patient noted with equal grip strengths and WNL shoulder AROM. Patient noted with diminished sensation to right side of her trunk down to medial and lateral malleoli, normal sensation to right UE, right foot and left side of body. Patient would benefit from skilled physical therapy to address weakness and  address patient's goals.     History and Personal Factors relevant to plan of care:  Previous strokes, 5 brain tumors    Clinical Presentation  Stable    Clinical Decision Making  Low    Rehab Potential  Excellent    Clinical Impairments Affecting Rehab Potential   highly motivated and determined    PT Frequency  2x / week    PT Duration  6 weeks    PT Treatment/Interventions  ADLs/Self Care Home Management;Functional mobility training;Therapeutic activities;Therapeutic exercise;Balance training;Neuromuscular re-education;Manual techniques;Passive range of motion;Patient/family education    PT Next Visit Plan  UE strengthening, postural exercises, functional UE exercises, fine motor tasks.    PT Home Exercise Plan  putty exercises yellow and orange    Consulted and Agree with Plan of Care  Patient       Patient will benefit from skilled therapeutic intervention in order to improve the following deficits and impairments:  Impaired UE functional use, Decreased strength, Decreased activity tolerance, Impaired sensation  Visit Diagnosis: Muscle weakness (generalized) - Plan: PT plan of care cert/re-cert     Problem List Patient Active Problem List   Diagnosis Date Noted  . Chest pain 01/24/2018  . Cerebrovascular accident (CVA) due to stenosis of left middle cerebral artery ([place]) 01/20/2018  . Meningioma ([place]) 08/06/2011    Gabriela Eves, PT, DPT 01/29/2018, 4:06 PM  [facility] [address] [place], [place], [address] Phone: [phone]   Fax:  [phone]  Name: KYNZLI REASE MRN: [id number] Date of Birth: 01-03-1970

## 2018-02-04 ENCOUNTER — Ambulatory Visit: Payer: Medicare Other | Admitting: Physical Therapy

## 2018-02-05 ENCOUNTER — Ambulatory Visit: Payer: Medicare Other | Attending: Neurology | Admitting: Physical Therapy

## 2018-02-05 ENCOUNTER — Encounter: Payer: Self-pay | Admitting: Physical Therapy

## 2018-02-05 DIAGNOSIS — M6281 Muscle weakness (generalized): Secondary | ICD-10-CM

## 2018-02-05 NOTE — Therapy (Signed)
[facility], [place], [address] Phone: [phone]   Fax:  [phone]  Physical Therapy Treatment  Patient Details  Name: Sheena Willis MRN: [id number] Date of Birth: 07-09-1970 Referring Provider (PT): Dr. Ala Bent   Encounter Date: 02/05/2018  PT End of Session - 02/05/18 1308    Visit Number  2    Number of Visits  12    Date for PT Re-Evaluation  03/19/18    PT Start Time  1304    PT Stop Time  1349    PT Time Calculation (min)  45 min    Activity Tolerance  Patient tolerated treatment well    Behavior During Therapy  [facility] for tasks assessed/performed       Past Medical History:  Diagnosis Date  . Brain tumor (benign) ([place])   . Radiation    for brain tumors  . Vertigo    due to benign brain tumor    Past Surgical History:  Procedure Laterality Date  . BRAIN BIOPSY  12/05/2009  . HALO APPLICATION     done before gamma knife radio surgery  . LESION REMOVAL Left 07/23/2012   Procedure: EXCISION SKIN NEOPLASM LEFT ELBOW;  Surgeon: Jamesetta So, MD;  Location: [facility];  Service: General;  Laterality: Left;  . MYRINGOTOMY WITH TUBE PLACEMENT Left    Dr. Benjamine Mola    There were no vitals filed for this visit.  Subjective Assessment - 02/05/18 1306    Subjective  Reports that the numbness along the R side of her body feels like after dental work. No pain currently. Reports hand/eye coordination and dexterity is lacking to braid. Reports that sweeping and mopping her floors took longer. Patient reports that she is only able to do 10 minute brisk walk once a day as of now.    Pertinent History  History of strokes, 5 brain tumors (benign)    Limitations  House hold activities    Diagnostic tests  MRI: infarcts in left ACA/MCA (per ED documentation 01/18/2018)    Patient Stated Goals  "get back to as close to normal, be able to braid hair"    Currently in Pain?  No/denies         [facility] PT Assessment -  02/05/18 0001      Assessment   Medical Diagnosis  CVA due to stenosis of left middle cerebral artery    Referring Provider (PT)  Dr. Ala Bent    Onset Date/Surgical Date  12/25/17    Hand Dominance  Right    Next MD Visit  02/07/2018    Prior Therapy  no      Precautions   Precautions  None      Restrictions   Weight Bearing Restrictions  No                   [facility] Adult PT Treatment/Exercise - 02/05/18 0001      Exercises   Exercises  Knee/Hip;Shoulder;Hand      Knee/Hip Exercises: Aerobic   Nustep  L3 x10 min      Knee/Hip Exercises: Seated   Long Arc Quad  Strengthening;Both;20 reps;Weights    Long Arc Quad Weight  4 lbs.    Clamshell with TheraBand  Red   x20 reps     Shoulder Exercises: Standing   External Rotation  AROM;Both;20 reps    Flexion  AROM;Both;20 reps    ABduction  AROM;Both;20 reps    Other Standing  Exercises  RUE wall ladder x5 reps       Shoulder Exercises: ROM/Strengthening   UBE (Upper Arm Bike)  120 RPM X4 MIN   forward and backward     Hand Exercises   Other Hand Exercises  Velcro board with various grips for wrist/hand strengthening                  PT Long Term Goals - 02/05/18 1312      PT LONG TERM GOAL #1   Title  Patient will be independent with HEP     Time  6    Period  Weeks    Status  On-going      PT LONG TERM GOAL #2   Title  Patient will demonstrate 4+/5 or greater right shoulder MMT in all planes to improve stability during functional tasks.    Time  6    Period  Weeks    Status  On-going      PT LONG TERM GOAL #3   Title  Patient will demonstrate 4+/5 or greater right LE MMT in all planes to improve stability during functional tasks.     Time  6    Period  Weeks    Status  On-going      PT LONG TERM GOAL #4   Title  Patient will report ability to perform functional tasks, toileting tasks, and ADLs independently.    Time  6    Period  Weeks    Status  On-going            Plan  - 02/05/18 1417    Clinical Impression Statement  Patient presented in clinic with no current pain but reports of lack of fine motor skills. Patient able to now complete shoulder IR to clean following use of bathroom use. Patient continues to experience numbness of R flank and LE but states that her RUE and R foot has normal sensation again. Patient reports that touch feels weird to affected regions. Patient continues to report limitations with advanced fine motor skills such as braiding and limited with prolonged activities. No complaints during any of the therex completed today.     Rehab Potential  Excellent    Clinical Impairments Affecting Rehab Potential  highly motivated and determined    PT Frequency  2x / week    PT Duration  6 weeks    PT Treatment/Interventions  ADLs/Self Care Home Management;Functional mobility training;Therapeutic activities;Therapeutic exercise;Balance training;Neuromuscular re-education;Manual techniques;Passive range of motion;Patient/family education    PT Next Visit Plan  UE strengthening, postural exercises, functional UE exercises, advanced fine motor skills.    PT Home Exercise Plan  putty exercises yellow and orange    Consulted and Agree with Plan of Care  Patient       Patient will benefit from skilled therapeutic intervention in order to improve the following deficits and impairments:  Impaired UE functional use, Decreased strength, Decreased activity tolerance, Impaired sensation  Visit Diagnosis: Muscle weakness (generalized)     Problem List Patient Active Problem List   Diagnosis Date Noted  . Chest pain 01/24/2018  . Cerebrovascular accident (CVA) due to stenosis of left middle cerebral artery ([place]) 01/20/2018  . Meningioma ([place]) 08/06/2011    Standley Brooking, PTA 02/05/2018, 2:26 PM  [facility] [address] [place], [place], [address] Phone: [phone]   Fax:  [phone]  Name: ALLANTE BEANE MRN: [id number] Date of Birth: August 25, 1970

## 2018-02-06 ENCOUNTER — Encounter: Payer: Medicare Other | Admitting: Physical Therapy

## 2018-02-07 DIAGNOSIS — D320 Benign neoplasm of cerebral meninges: Secondary | ICD-10-CM | POA: Diagnosis not present

## 2018-02-07 DIAGNOSIS — E785 Hyperlipidemia, unspecified: Secondary | ICD-10-CM | POA: Diagnosis not present

## 2018-02-07 DIAGNOSIS — Z7982 Long term (current) use of aspirin: Secondary | ICD-10-CM | POA: Diagnosis not present

## 2018-02-07 DIAGNOSIS — Z87891 Personal history of nicotine dependence: Secondary | ICD-10-CM | POA: Diagnosis not present

## 2018-02-07 DIAGNOSIS — Z09 Encounter for follow-up examination after completed treatment for conditions other than malignant neoplasm: Secondary | ICD-10-CM | POA: Diagnosis not present

## 2018-02-07 DIAGNOSIS — R200 Anesthesia of skin: Secondary | ICD-10-CM | POA: Diagnosis not present

## 2018-02-07 DIAGNOSIS — I6522 Occlusion and stenosis of left carotid artery: Secondary | ICD-10-CM | POA: Diagnosis not present

## 2018-02-07 DIAGNOSIS — I69398 Other sequelae of cerebral infarction: Secondary | ICD-10-CM | POA: Diagnosis not present

## 2018-02-07 DIAGNOSIS — Z7902 Long term (current) use of antithrombotics/antiplatelets: Secondary | ICD-10-CM | POA: Diagnosis not present

## 2018-02-07 DIAGNOSIS — Z79899 Other long term (current) drug therapy: Secondary | ICD-10-CM | POA: Diagnosis not present

## 2018-02-07 DIAGNOSIS — I6523 Occlusion and stenosis of bilateral carotid arteries: Secondary | ICD-10-CM | POA: Diagnosis not present

## 2018-02-07 DIAGNOSIS — Z8673 Personal history of transient ischemic attack (TIA), and cerebral infarction without residual deficits: Secondary | ICD-10-CM | POA: Diagnosis not present

## 2018-02-11 ENCOUNTER — Ambulatory Visit: Payer: Medicare Other | Admitting: Physical Therapy

## 2018-02-11 ENCOUNTER — Encounter: Payer: Self-pay | Admitting: Physical Therapy

## 2018-02-11 DIAGNOSIS — M6281 Muscle weakness (generalized): Secondary | ICD-10-CM | POA: Diagnosis not present

## 2018-02-11 NOTE — Therapy (Signed)
[facility] [place], [place], [address] Phone: [phone]   Fax:  [phone]  Physical Therapy Treatment  Patient Details  Name: Sheena Willis MRN: [id number] Date of Birth: [birth year] Referring Provider (PT): Dr. Ala Bent   Encounter Date: 02/11/2018  PT End of Session - 02/11/18 1610    Visit Number  3    Number of Visits  12    Date for PT Re-Evaluation  03/19/18    Authorization Type  Progress note every 10th visit.    PT Start Time  1600    PT Stop Time  1654    PT Time Calculation (min)  54 min    Activity Tolerance  Patient tolerated treatment well    Behavior During Therapy  WFL for tasks assessed/performed       Past Medical History:  Diagnosis Date  . Brain tumor (benign) ([place])   . Radiation    for brain tumors  . Vertigo    due to benign brain tumor    Past Surgical History:  Procedure Laterality Date  . BRAIN BIOPSY  12/05/2009  . HALO APPLICATION     done before gamma knife radio surgery  . LESION REMOVAL Left 07/23/2012   Procedure: EXCISION SKIN NEOPLASM LEFT ELBOW;  Surgeon: Jamesetta So, MD;  Location: [facility];  Service: General;  Laterality: Left;  . MYRINGOTOMY WITH TUBE PLACEMENT Left    Dr. Benjamine Mola    There were no vitals filed for this visit.  Subjective Assessment - 02/11/18 1611    Subjective  Reported she was able to braid her hair but took about a total 4 hours with rests to do it.     Pertinent History  History of strokes, 5 brain tumors (benign)    Limitations  House hold activities    Diagnostic tests  MRI: infarcts in left ACA/MCA (per ED documentation 01/18/2018)    Patient Stated Goals  "get back to as close to normal, be able to braid hair"         [facility] PT Assessment - 02/11/18 0001      Assessment   Medical Diagnosis  CVA due to stenosis of left middle cerebral artery    Referring Provider (PT)  Dr. Ala Bent    Onset Date/Surgical Date  12/25/17    Hand  Dominance  Right    Next MD Visit  02/07/2018    Prior Therapy  no                   [facility] Adult PT Treatment/Exercise - 02/11/18 0001      Exercises   Exercises  Elbow;Hand      Elbow Exercises   Elbow Flexion  Strengthening;20 reps   3 ways, biceps, hammer and palm down   Bar Weights/Barbell (Elbow Flexion)  3 lbs    Elbow Extension  Strengthening;20 reps;Standing    Bar Weights/Barbell (Elbow Extension)  3 lbs      Knee/Hip Exercises: Aerobic   Nustep  Level 4 x10 mins      Knee/Hip Exercises: Supine   Bridges with Clamshell  Strengthening;Both;2 sets;10 reps   red theraband     Shoulder Exercises: ROM/Strengthening   UBE (Upper Arm Bike)  90 RPM X6 MIN, 3 fwd, 3 bwd    Wall Pushups  20 reps      Hand Exercises   Digiticizer  --    Rubberbands  finger abduction 3x10, thumb abduction 3x10  Other Hand Exercises  wrist flexion and extension 3# 3x10    Other Hand Exercises  red web finger gripping with pull x10                  PT Long Term Goals - 02/05/18 1312      PT LONG TERM GOAL #1   Title  Patient will be independent with HEP     Time  6    Period  Weeks    Status  On-going      PT LONG TERM GOAL #2   Title  Patient will demonstrate 4+/5 or greater right shoulder MMT in all planes to improve stability during functional tasks.    Time  6    Period  Weeks    Status  On-going      PT LONG TERM GOAL #3   Title  Patient will demonstrate 4+/5 or greater right LE MMT in all planes to improve stability during functional tasks.     Time  6    Period  Weeks    Status  On-going      PT LONG TERM GOAL #4   Title  Patient will report ability to perform functional tasks, toileting tasks, and ADLs independently.    Time  6    Period  Weeks    Status  On-going            Plan - 02/11/18 1611    Clinical Impression Statement  Patient was able to tolerate exercises well with no pain, just some reports of muscle fatigue. Patient guided  through exercises to improve fine motor tasks. Patient demonstrated good form with all exercises. Patient instructed to continue HEP as well as to try to perform all ADLs to the best of her ability and have help as needed. Patient reported understanding.    Clinical Presentation  Stable    Clinical Decision Making  Low    Rehab Potential  Excellent    Clinical Impairments Affecting Rehab Potential  highly motivated and determined    PT Frequency  2x / week    PT Duration  6 weeks    PT Treatment/Interventions  ADLs/Self Care Home Management;Functional mobility training;Therapeutic activities;Therapeutic exercise;Balance training;Neuromuscular re-education;Manual techniques;Passive range of motion;Patient/family education    PT Next Visit Plan  UE strengthening, postural exercises, functional UE exercises, advanced fine motor skills.    PT Home Exercise Plan  putty exercises yellow and orange    Consulted and Agree with Plan of Care  Patient       Patient will benefit from skilled therapeutic intervention in order to improve the following deficits and impairments:  Impaired UE functional use, Decreased strength, Decreased activity tolerance, Impaired sensation  Visit Diagnosis: Muscle weakness (generalized)     Problem List Patient Active Problem List   Diagnosis Date Noted  . Chest pain 01/24/2018  . Cerebrovascular accident (CVA) due to stenosis of left middle cerebral artery ([place]) 01/20/2018  . Meningioma ([place]) 08/06/2011    Gabriela Eves, PT, DPT 02/11/2018, 5:04 PM  [facility] [address] [place], [place], [address] Phone: [phone]   Fax:  [phone]  Name: Sheena Willis MRN: [id number] Date of Birth: 1970-05-21

## 2018-02-13 ENCOUNTER — Ambulatory Visit: Payer: Medicare Other | Admitting: Physical Therapy

## 2018-02-17 ENCOUNTER — Encounter: Payer: Self-pay | Admitting: Physical Therapy

## 2018-02-17 ENCOUNTER — Ambulatory Visit: Payer: Medicare Other | Admitting: Physical Therapy

## 2018-02-17 DIAGNOSIS — M6281 Muscle weakness (generalized): Secondary | ICD-10-CM | POA: Diagnosis not present

## 2018-02-17 NOTE — Therapy (Signed)
[facility] [place], [place], [address] Phone: [phone]   Fax:  [phone]  Physical Therapy Treatment  Patient Details  Name: Sheena Willis MRN: [id number] Date of Birth: 1970-02-27 Referring Provider (PT): Dr. Ala Bent   Encounter Date: 02/17/2018  PT End of Session - 02/17/18 1355    Visit Number  4    Number of Visits  12    Date for PT Re-Evaluation  03/19/18    Authorization Type  Progress note every 10th visit.    PT Start Time  1351    PT Stop Time  1431    PT Time Calculation (min)  40 min    Activity Tolerance  Patient tolerated treatment well    Behavior During Therapy  WFL for tasks assessed/performed       Past Medical History:  Diagnosis Date  . Brain tumor (benign) ([place])   . Radiation    for brain tumors  . Vertigo    due to benign brain tumor    Past Surgical History:  Procedure Laterality Date  . BRAIN BIOPSY  12/05/2009  . HALO APPLICATION     done before gamma knife radio surgery  . LESION REMOVAL Left 07/23/2012   Procedure: EXCISION SKIN NEOPLASM LEFT ELBOW;  Surgeon: Jamesetta So, MD;  Location: [facility];  Service: General;  Laterality: Left;  . MYRINGOTOMY WITH TUBE PLACEMENT Left    Dr. Benjamine Mola    There were no vitals filed for this visit.  Subjective Assessment - 02/17/18 1353    Subjective  Patient arrived with no new complaints and doing well thus far    Pertinent History  History of strokes, 5 brain tumors (benign)    Limitations  House hold activities    Diagnostic tests  MRI: infarcts in left ACA/MCA (per ED documentation 01/18/2018)    Patient Stated Goals  "get back to as close to normal, be able to braid hair"    Currently in Pain?  No/denies                       [facility] Adult PT Treatment/Exercise - 02/17/18 0001      Knee/Hip Exercises: Aerobic   Nustep  Level 4 x10 mins      Knee/Hip Exercises: Seated   Long Arc Quad  Strengthening;Both;20  reps;Weights    Long Arc Quad Weight  4 lbs.    Sit to Sand  20 reps;without UE support      Knee/Hip Exercises: Supine   Bridges with Clamshell  Strengthening;Both;2 sets;10 reps   red t-band   Straight Leg Raises  20 reps;Strengthening;Both      Knee/Hip Exercises: Sidelying   Hip ABduction  Strengthening;20 reps;Both      Shoulder Exercises: Standing   External Rotation  Strengthening;Right;20 reps;Theraband    Theraband Level (Shoulder External Rotation)  Level 1 (Yellow)    Internal Rotation  Strengthening;Right;Theraband;20 reps    Theraband Level (Shoulder Internal Rotation)  Level 1 (Yellow)    Flexion  Strengthening;Right;20 reps;Theraband    Theraband Level (Shoulder Flexion)  Level 1 (Yellow)    Extension  Strengthening;Both;Theraband;Other (comment)    Theraband Level (Shoulder Extension)  Other (comment)    Extension Limitations  pink XTS    Row  Strengthening;Right;20 reps;Theraband    Theraband Level (Shoulder Row)  Level 1 (Yellow)    Retraction  Strengthening;Both;20 reps;Theraband;Other (comment)    Theraband Level (Shoulder Retraction)  Other (comment)  Retraction Weight (lbs)  pink XTS    Diagonals  Strengthening;20 reps;Theraband   bil sides   Theraband Level (Shoulder Diagonals)  Other (comment)    Diagonals Limitations  pink XTS      Shoulder Exercises: ROM/Strengthening   UBE (Upper Arm Bike)  90 RPM X6 MIN, 3 fwd, 3 bwd                  PT Long Term Goals - 02/05/18 1312      PT LONG TERM GOAL #1   Title  Patient will be independent with HEP     Time  6    Period  Weeks    Status  On-going      PT LONG TERM GOAL #2   Title  Patient will demonstrate 4+/5 or greater right shoulder MMT in all planes to improve stability during functional tasks.    Time  6    Period  Weeks    Status  On-going      PT LONG TERM GOAL #3   Title  Patient will demonstrate 4+/5 or greater right LE MMT in all planes to improve stability during functional  tasks.     Time  6    Period  Weeks    Status  On-going      PT LONG TERM GOAL #4   Title  Patient will report ability to perform functional tasks, toileting tasks, and ADLs independently.    Time  6    Period  Weeks    Status  On-going            Plan - 02/17/18 1426    Clinical Impression Statement  Patient tolerated treatment well today. Patient able to progress with exercises today with no complaints. Patient has reported improvement and back to performing ADL's almost all of her normal daily tasks. Patient progressing toward goals.     Rehab Potential  Excellent    Clinical Impairments Affecting Rehab Potential  highly motivated and determined    PT Frequency  2x / week    PT Duration  6 weeks    PT Treatment/Interventions  ADLs/Self Care Home Management;Functional mobility training;Therapeutic activities;Therapeutic exercise;Balance training;Neuromuscular re-education;Manual techniques;Passive range of motion;Patient/family education    PT Next Visit Plan  UE strengthening, postural exercises, functional UE exercises, advanced fine motor skills.    Consulted and Agree with Plan of Care  Patient       Patient will benefit from skilled therapeutic intervention in order to improve the following deficits and impairments:  Impaired UE functional use, Decreased strength, Decreased activity tolerance, Impaired sensation  Visit Diagnosis: Muscle weakness (generalized)     Problem List Patient Active Problem List   Diagnosis Date Noted  . Chest pain 01/24/2018  . Cerebrovascular accident (CVA) due to stenosis of left middle cerebral artery ([place]) 01/20/2018  . Meningioma ([place]) 08/06/2011    Jeffie Widdowson P, PTA 02/17/2018, 2:31 PM  [facility] [place], [place], [address] Phone: [phone]   Fax:  [phone]  Name: SHATASIA CUTSHAW MRN: [id number] Date of Birth: [birth year]

## 2018-02-19 ENCOUNTER — Encounter: Payer: Self-pay | Admitting: Pharmacist Clinician (PhC)/ Clinical Pharmacy Specialist

## 2018-02-19 ENCOUNTER — Ambulatory Visit (INDEPENDENT_AMBULATORY_CARE_PROVIDER_SITE_OTHER): Payer: Medicare Other | Admitting: Pharmacist Clinician (PhC)/ Clinical Pharmacy Specialist

## 2018-02-19 VITALS — BP 105/68 | HR 70

## 2018-02-19 DIAGNOSIS — E785 Hyperlipidemia, unspecified: Secondary | ICD-10-CM

## 2018-02-19 NOTE — Progress Notes (Signed)
Ms. Kohlman is a 48 year old female who is referred by her PCP for a lipid consultation.  She was admitted for CVA 1/18-1/22/2020. She was found to have left middle carotid artery stenosis. She also has multiple benign brain tumors per patient and has a follow up at [facility] in the next 2 weeks.  Patient states that her dad has had two CABG with stents and is taking a statin.  She is unaware of any other cardiac history in her family or history of hyperlipidemia. Previous history of nicotine abuse, patient has now quit all tobacco and drinking alcohol.  A/P:  1.  Hyperlipidemia:  Patient is taking atorvastatin 40mg .  LDL target is 70mg /dL.  She had a lipid/liver panel drawn today after starting atorvastatin 4 weeks ago.  I also added a TSH to rule out secondary causes since there is no history of one being checked in her chart.  2.  Counseled patient extensively on low cholesterol diet and exercise.  Her cardiologist has already started her on an exercise plan of walking 29minutes three times a day and she also does daily yoga.  Patient has started to make some dietary changes but had many questions on what she should be eating.  I also gave patient a personlized meal plan and low cholesterol pearls as well as an explanation on her cholesterol goals.  Follow up based on lab results which are pending  Total time with patient:  45 minutes  Memory Argue, PharmD, CPP, CLS

## 2018-02-20 ENCOUNTER — Encounter: Payer: Self-pay | Admitting: Physical Therapy

## 2018-02-20 ENCOUNTER — Ambulatory Visit: Payer: Medicare Other | Admitting: Physical Therapy

## 2018-02-20 DIAGNOSIS — M6281 Muscle weakness (generalized): Secondary | ICD-10-CM

## 2018-02-20 LAB — CMP14+EGFR
ALT: 20 IU/L (ref 0–32)
AST: 11 IU/L (ref 0–40)
Albumin/Globulin Ratio: 2 (ref 1.2–2.2)
Albumin: 4.2 g/dL (ref 3.8–4.8)
Alkaline Phosphatase: 83 IU/L (ref 39–117)
BUN/Creatinine Ratio: 10 (ref 9–23)
BUN: 8 mg/dL (ref 6–24)
Bilirubin Total: 0.2 mg/dL (ref 0.0–1.2)
CALCIUM: 9.3 mg/dL (ref 8.7–10.2)
CO2: 22 mmol/L (ref 20–29)
CREATININE: 0.78 mg/dL (ref 0.57–1.00)
Chloride: 100 mmol/L (ref 96–106)
GFR calc Af Amer: 105 mL/min/{1.73_m2} (ref >59)
GFR, EST NON AFRICAN AMERICAN: 91 mL/min/{1.73_m2} (ref >59)
Globulin, Total: 2.1 g/dL (ref 1.5–4.5)
Glucose: 88 mg/dL (ref 65–99)
Potassium: 4.3 mmol/L (ref 3.5–5.2)
Sodium: 140 mmol/L (ref 134–144)
Total Protein: 6.3 g/dL (ref 6.0–8.5)

## 2018-02-20 LAB — LIPID PANEL
Chol/HDL Ratio: 3.1 ratio (ref 0.0–4.4)
Cholesterol, Total: 104 mg/dL (ref 100–199)
HDL: 34 mg/dL — ABNORMAL LOW (ref >39)
LDL Calculated: 56 mg/dL (ref 0–99)
Triglycerides: 69 mg/dL (ref 0–149)
VLDL CHOLESTEROL CAL: 14 mg/dL (ref 5–40)

## 2018-02-20 NOTE — Therapy (Signed)
[facility] [place], [place], [address] Phone: [phone]   Fax:  [phone]  Physical Therapy Treatment  Patient Details  Name: SURIAH PERAGINE MRN: [id number] Date of Birth: Dec 12, 1970 Referring Provider (PT): Dr. Ala Bent   Encounter Date: 02/20/2018  PT End of Session - 02/20/18 1408    Visit Number  5    Number of Visits  12    Date for PT Re-Evaluation  03/19/18    Authorization Type  Progress note every 10th visit.    PT Start Time  1345    PT Stop Time  1426    PT Time Calculation (min)  41 min    Activity Tolerance  Patient tolerated treatment well    Behavior During Therapy  WFL for tasks assessed/performed       Past Medical History:  Diagnosis Date  . Brain tumor (benign) ([place])   . Radiation    for brain tumors  . Vertigo    due to benign brain tumor    Past Surgical History:  Procedure Laterality Date  . BRAIN BIOPSY  12/05/2009  . HALO APPLICATION     done before gamma knife radio surgery  . LESION REMOVAL Left 07/23/2012   Procedure: EXCISION SKIN NEOPLASM LEFT ELBOW;  Surgeon: Jamesetta So, MD;  Location: [facility];  Service: General;  Laterality: Left;  . MYRINGOTOMY WITH TUBE PLACEMENT Left    Dr. Benjamine Mola    There were no vitals filed for this visit.  Subjective Assessment - 02/20/18 1347    Subjective  No new complaints and did good after last treatment    Pertinent History  History of strokes, 5 brain tumors (benign)    Limitations  House hold activities    Diagnostic tests  MRI: infarcts in left ACA/MCA (per ED documentation 01/18/2018)    Patient Stated Goals  "get back to as close to normal, be able to braid hair"    Currently in Pain?  No/denies                       [facility] Adult PT Treatment/Exercise - 02/20/18 0001      Elbow Exercises   Elbow Flexion  Strengthening;Both;20 reps;10 reps    Bar Weights/Barbell (Elbow Flexion)  Other (comment)    Elbow Flexion  Limitations  10# machine    Elbow Extension  Strengthening;Both;20 reps;10 reps    Bar Weights/Barbell (Elbow Extension)  Other (comment)    Elbow Extension Limitations  10# machine      Knee/Hip Exercises: Aerobic   Nustep  Level 4 x10 mins      Knee/Hip Exercises: Machines for Strengthening   Cybex Knee Extension  10# 3x10    Cybex Knee Flexion  20# 3x10      Shoulder Exercises: Standing   External Rotation  Strengthening;Right;20 reps;Theraband    Theraband Level (Shoulder External Rotation)  Level 2 (Red)    Internal Rotation  Strengthening;Right;Theraband;20 reps    Theraband Level (Shoulder Internal Rotation)  Level 2 (Red)    Flexion  Strengthening;Right;20 reps;Theraband    Theraband Level (Shoulder Flexion)  Level 2 (Red)    Extension  Strengthening;Both;Theraband;Other (comment);20 reps    Theraband Level (Shoulder Extension)  Level 2 (Red)    Retraction  Strengthening;Both;20 reps    Theraband Level (Shoulder Retraction)  Other (comment)    Retraction Weight (lbs)  pink XTS    Diagonals  Strengthening;20 reps;Theraband    Theraband Level (  Shoulder Diagonals)  Other (comment)    Diagonals Limitations  pink XTS      Shoulder Exercises: ROM/Strengthening   UBE (Upper Arm Bike)  90 RPM X8 MIN, 4 fwd, 4 bwd    Wall Pushups  10 reps   at incline (high mat table)                 PT Long Term Goals - 02/20/18 1409      PT LONG TERM GOAL #1   Title  Patient will be independent with HEP     Time  6    Period  Weeks    Status  On-going      PT LONG TERM GOAL #2   Title  Patient will demonstrate 4+/5 or greater right shoulder MMT in all planes to improve stability during functional tasks.    Time  6    Period  Weeks    Status  On-going   NT 02/20/18     PT LONG TERM GOAL #3   Title  Patient will demonstrate 4+/5 or greater right LE MMT in all planes to improve stability during functional tasks.     Time  6    Period  Weeks    Status  On-going   NT  02/20/18     PT LONG TERM GOAL #4   Title  Patient will report ability to perform functional tasks, toileting tasks, and ADLs independently.    Time  6    Period  Weeks    Status  Achieved   Patient able to perform all ADL's independently per reorted 02/20/18           Plan - 02/20/18 1424    Clinical Impression Statement  Patient tolerated treatment well today. Patient has reported being able to perform all ADL's with greater ease. Patient able to progress all exercises today with no difficulty. Patient reported overall progress and getting stronger. Met LTG #4 today with others ongoing.     Rehab Potential  Excellent    Clinical Impairments Affecting Rehab Potential  highly motivated and determined    PT Frequency  2x / week    PT Duration  6 weeks    PT Treatment/Interventions  ADLs/Self Care Home Management;Functional mobility training;Therapeutic activities;Therapeutic exercise;Balance training;Neuromuscular re-education;Manual techniques;Passive range of motion;Patient/family education    PT Next Visit Plan  UE strengthening, postural exercises, functional UE exercises, advanced fine motor skills.    Consulted and Agree with Plan of Care  Patient       Patient will benefit from skilled therapeutic intervention in order to improve the following deficits and impairments:  Impaired UE functional use, Decreased strength, Decreased activity tolerance, Impaired sensation  Visit Diagnosis: Muscle weakness (generalized)     Problem List Patient Active Problem List   Diagnosis Date Noted  . Chest pain 01/24/2018  . Cerebrovascular accident (CVA) due to stenosis of left middle cerebral artery ([place]) 01/20/2018  . Meningioma ([place]) 08/06/2011    Alanson Hausmann P, PTA 02/20/2018, 2:27 PM  [facility] [place], [place], [address] Phone: [phone]   Fax:  [phone]  Name: MICAYLA BRATHWAITE MRN: [id number] Date of  Birth: July 26, 1970

## 2018-02-21 LAB — SPECIMEN STATUS REPORT

## 2018-02-21 LAB — TSH: TSH: 0.654 u[IU]/mL (ref 0.450–4.500)

## 2018-02-25 ENCOUNTER — Ambulatory Visit: Payer: Medicare Other | Admitting: *Deleted

## 2018-02-25 DIAGNOSIS — M6281 Muscle weakness (generalized): Secondary | ICD-10-CM

## 2018-02-25 NOTE — Therapy (Signed)
[facility] [place], [place], [address] Phone: [phone]   Fax:  [phone]  Physical Therapy Treatment  Patient Details  Name: Sheena Willis MRN: [id number] Date of Birth: 1970/06/08 Referring Provider (PT): Dr. Ala Bent   Encounter Date: 02/25/2018  PT End of Session - 02/25/18 1635    Visit Number  6    Number of Visits  12    Date for PT Re-Evaluation  03/19/18    Authorization Type  Progress note every 10th visit.    PT Start Time  1345    PT Stop Time  1432    PT Time Calculation (min)  47 min       Past Medical History:  Diagnosis Date  . Brain tumor (benign) ([place])   . Radiation    for brain tumors  . Vertigo    due to benign brain tumor    Past Surgical History:  Procedure Laterality Date  . BRAIN BIOPSY  12/05/2009  . HALO APPLICATION     done before gamma knife radio surgery  . LESION REMOVAL Left 07/23/2012   Procedure: EXCISION SKIN NEOPLASM LEFT ELBOW;  Surgeon: Jamesetta So, MD;  Location: [facility];  Service: General;  Laterality: Left;  . MYRINGOTOMY WITH TUBE PLACEMENT Left    Dr. Benjamine Mola    There were no vitals filed for this visit.  Subjective Assessment - 02/25/18 1348    Subjective  Good after last Rx    Pertinent History  History of strokes, 5 brain tumors (benign)    Limitations  House hold activities    Diagnostic tests  MRI: infarcts in left ACA/MCA (per ED documentation 01/18/2018)    Patient Stated Goals  "get back to as close to normal, be able to braid hair"    Currently in Pain?  No/denies                       [facility] Adult PT Treatment/Exercise - 02/25/18 0001      Elbow Exercises   Elbow Flexion  Strengthening;Both;10 reps   with overhead press  2x10   Bar Weights/Barbell (Elbow Flexion)  4 lbs    Bar Weights/Barbell (Elbow Extension)  Other (comment)      Knee/Hip Exercises: Aerobic   Nustep  Level 4 x10 mins      Knee/Hip Exercises: Machines  for Strengthening   Cybex Knee Extension  20# 3x10    Cybex Knee Flexion  20# 3x10      Shoulder Exercises: Standing   Retraction  Strengthening;Both;20 reps    Theraband Level (Shoulder Retraction)  Other (comment)    Retraction Weight (lbs)  pink XTS    Diagonals  Strengthening;20 reps;Theraband    Theraband Level (Shoulder Diagonals)  Other (comment)    Diagonals Limitations  pink XTS      Shoulder Exercises: ROM/Strengthening   UBE (Upper Arm Bike)  90 RPM X8 MIN, 4 fwd, 4 bwd    Wall Pushups  20 reps    Other ROM/Strengthening Exercises  2# ball diagonals RT and LT UE 2x10 each                  PT Long Term Goals - 02/20/18 1409      PT LONG TERM GOAL #1   Title  Patient will be independent with HEP     Time  6    Period  Weeks    Status  On-going  PT LONG TERM GOAL #2   Title  Patient will demonstrate 4+/5 or greater right shoulder MMT in all planes to improve stability during functional tasks.    Time  6    Period  Weeks    Status  On-going   NT 02/20/18     PT LONG TERM GOAL #3   Title  Patient will demonstrate 4+/5 or greater right LE MMT in all planes to improve stability during functional tasks.     Time  6    Period  Weeks    Status  On-going   NT 02/20/18     PT LONG TERM GOAL #4   Title  Patient will report ability to perform functional tasks, toileting tasks, and ADLs independently.    Time  6    Period  Weeks    Status  Achieved   Patient able to perform all ADL's independently per reorted 02/20/18           Plan - 02/25/18 1637    Clinical Impression Statement  Pt arrived today doing fairly well  and reports doing better with breathing and coordination.  She did well with therex today with mainly fatigue.    Rehab Potential  Excellent    Clinical Impairments Affecting Rehab Potential  highly motivated and determined    PT Frequency  2x / week    PT Duration  6 weeks    PT Treatment/Interventions  ADLs/Self Care Home  Management;Functional mobility training;Therapeutic activities;Therapeutic exercise;Balance training;Neuromuscular re-education;Manual techniques;Passive range of motion;Patient/family education    PT Next Visit Plan  UE strengthening, postural exercises, functional UE exercises, advanced fine motor skills.    PT Home Exercise Plan  putty exercises yellow and orange    Consulted and Agree with Plan of Care  Patient       Patient will benefit from skilled therapeutic intervention in order to improve the following deficits and impairments:  Impaired UE functional use, Decreased strength, Decreased activity tolerance, Impaired sensation  Visit Diagnosis: Muscle weakness (generalized)     Problem List Patient Active Problem List   Diagnosis Date Noted  . Chest pain 01/24/2018  . Cerebrovascular accident (CVA) due to stenosis of left middle cerebral artery ([place]) 01/20/2018  . Meningioma ([place]) 08/06/2011    RAMSEUR,CHRIS, PTA 02/25/2018, 5:54 PM  [facility] [address] [place], [place], [address] Phone: [phone]   Fax:  [phone]  Name: Sheena Willis MRN: [id number] Date of Birth: 1970-02-26

## 2018-02-27 ENCOUNTER — Ambulatory Visit: Payer: Medicare Other | Admitting: Physical Therapy

## 2018-02-27 ENCOUNTER — Encounter: Payer: Self-pay | Admitting: Physical Therapy

## 2018-02-27 DIAGNOSIS — M6281 Muscle weakness (generalized): Secondary | ICD-10-CM

## 2018-02-27 NOTE — Therapy (Signed)
[facility] [place], [place], [address] Phone: [phone]   Fax:  [phone]  Physical Therapy Treatment  Patient Details  Name: Sheena Willis MRN: [id number] Date of Birth: 1970/10/05 Referring Provider (PT): Dr. Ala Bent   Encounter Date: 02/27/2018  PT End of Session - 02/27/18 1354    Visit Number  7    Number of Visits  12    Date for PT Re-Evaluation  03/19/18    Authorization Type  Progress note every 10th visit.    PT Start Time  1346    PT Stop Time  1430    PT Time Calculation (min)  44 min    Activity Tolerance  Patient tolerated treatment well    Behavior During Therapy  WFL for tasks assessed/performed       Past Medical History:  Diagnosis Date  . Brain tumor (benign) ([place])   . Radiation    for brain tumors  . Vertigo    due to benign brain tumor    Past Surgical History:  Procedure Laterality Date  . BRAIN BIOPSY  12/05/2009  . HALO APPLICATION     done before gamma knife radio surgery  . LESION REMOVAL Left 07/23/2012   Procedure: EXCISION SKIN NEOPLASM LEFT ELBOW;  Surgeon: Jamesetta So, MD;  Location: [facility];  Service: General;  Laterality: Left;  . MYRINGOTOMY WITH TUBE PLACEMENT Left    Dr. Benjamine Mola    There were no vitals filed for this visit.      [facility] PT Assessment - 02/27/18 0001      Assessment   Medical Diagnosis  CVA due to stenosis of left middle cerebral artery    Referring Provider (PT)  Dr. Ala Bent    Onset Date/Surgical Date  12/25/17    Hand Dominance  Right    Next MD Visit  02/07/2018    Prior Therapy  no                   [facility] Adult PT Treatment/Exercise - 02/27/18 0001      Knee/Hip Exercises: Aerobic   Nustep  Level 5 x10 mins; 60+ steps per minute ended with 628 steps      Knee/Hip Exercises: Machines for Strengthening   Cybex Knee Extension  20# 3x10    Cybex Knee Flexion  20# 3x10    Cybex Leg Press  2 plates 2x10      Knee/Hip  Exercises: Standing   Other Standing Knee Exercises  narrow BOS with 2# ball toss x10, tandem stance with ball toss 2# x10 each       Shoulder Exercises: Standing   Retraction  Strengthening;Both;20 reps    Theraband Level (Shoulder Retraction)  Other (comment)    Retraction Weight (lbs)  pink XTS    Diagonals  Strengthening;20 reps;Theraband    Theraband Level (Shoulder Diagonals)  Other (comment)    Diagonals Limitations  pink XTS woodchop    Other Standing Exercises  wall clock with 4# medicine ball x5 each      Shoulder Exercises: ROM/Strengthening   UBE (Upper Arm Bike)  60 RPM X8 MIN, 4 fwd, 4 bwd                  PT Long Term Goals - 02/20/18 1409      PT LONG TERM GOAL #1   Title  Patient will be independent with HEP     Time  6  Period  Weeks    Status  On-going      PT LONG TERM GOAL #2   Title  Patient will demonstrate 4+/5 or greater right shoulder MMT in all planes to improve stability during functional tasks.    Time  6    Period  Weeks    Status  On-going   NT 02/20/18     PT LONG TERM GOAL #3   Title  Patient will demonstrate 4+/5 or greater right LE MMT in all planes to improve stability during functional tasks.     Time  6    Period  Weeks    Status  On-going   NT 02/20/18     PT LONG TERM GOAL #4   Title  Patient will report ability to perform functional tasks, toileting tasks, and ADLs independently.    Time  6    Period  Weeks    Status  Achieved   Patient able to perform all ADL's independently per reorted 02/20/18           Plan - 02/27/18 1358    Clinical Impression Statement  Patient was able to tolerarte treatment well with good form and technique with all exercises. Patient required close supervision for balance ball toss activity but otherwise noted with good use of ankle and hip strategies. Patient was able to maintain 60+ step rate with no adverse affects.     Clinical Presentation  Stable    Clinical Decision Making  Low     Clinical Impairments Affecting Rehab Potential  highly motivated and determined    PT Frequency  2x / week    PT Duration  6 weeks    PT Treatment/Interventions  ADLs/Self Care Home Management;Functional mobility training;Therapeutic activities;Therapeutic exercise;Balance training;Neuromuscular re-education;Manual techniques;Passive range of motion;Patient/family education    PT Next Visit Plan  UE strengthening, postural exercises, functional UE exercises, advanced fine motor skills.    Consulted and Agree with Plan of Care  Patient       Patient will benefit from skilled therapeutic intervention in order to improve the following deficits and impairments:  Impaired UE functional use, Decreased strength, Decreased activity tolerance, Impaired sensation  Visit Diagnosis: Muscle weakness (generalized)     Problem List Patient Active Problem List   Diagnosis Date Noted  . Chest pain 01/24/2018  . Cerebrovascular accident (CVA) due to stenosis of left middle cerebral artery ([place]) 01/20/2018  . Meningioma ([place]) 08/06/2011    Gabriela Eves, PT, DPT 02/27/2018, 3:58 PM  [facility] [address] [place], [place], [address] Phone: [phone]   Fax:  [phone]  Name: Sheena Willis MRN: [id number] Date of Birth: 01-24-1970

## 2018-03-04 ENCOUNTER — Ambulatory Visit: Payer: Medicare Other | Attending: Neurology | Admitting: *Deleted

## 2018-03-04 DIAGNOSIS — M6281 Muscle weakness (generalized): Secondary | ICD-10-CM | POA: Insufficient documentation

## 2018-03-04 NOTE — Therapy (Signed)
[facility] [place], [place], [address] Phone: [phone]   Fax:  [phone]  Physical Therapy Treatment  Patient Details  Name: Sheena Willis MRN: [id number] Date of Birth: 1970/04/02 Referring Provider (PT): Dr. Ala Bent   Encounter Date: 03/04/2018  PT End of Session - 03/04/18 1400    Visit Number  8    Number of Visits  12    Date for PT Re-Evaluation  03/19/18    Authorization Type  Progress note every 10th visit.    PT Start Time  1355    PT Stop Time  1438    PT Time Calculation (min)  43 min       Past Medical History:  Diagnosis Date  . Brain tumor (benign) ([place])   . Radiation    for brain tumors  . Vertigo    due to benign brain tumor    Past Surgical History:  Procedure Laterality Date  . BRAIN BIOPSY  12/05/2009  . HALO APPLICATION     done before gamma knife radio surgery  . LESION REMOVAL Left 07/23/2012   Procedure: EXCISION SKIN NEOPLASM LEFT ELBOW;  Surgeon: Jamesetta So, MD;  Location: [facility];  Service: General;  Laterality: Left;  . MYRINGOTOMY WITH TUBE PLACEMENT Left    Dr. Benjamine Mola    There were no vitals filed for this visit.  Subjective Assessment - 03/04/18 1400    Subjective  Continue to do better    Pertinent History  History of strokes, 5 brain tumors (benign)    Limitations  House hold activities    Diagnostic tests  MRI: infarcts in left ACA/MCA (per ED documentation 01/18/2018)    Patient Stated Goals  "get back to as close to normal, be able to braid hair"    Currently in Pain?  No/denies                       [facility] Adult PT Treatment/Exercise - 03/04/18 0001      Knee/Hip Exercises: Aerobic   Nustep  Level 5 x10 mins; 60+ steps per minute ended with 690 steps      Knee/Hip Exercises: Machines for Strengthening   Cybex Knee Extension  20# 3x10    Cybex Knee Flexion  20# 3x10    Cybex Leg Press  2 plates 2x10      Knee/Hip Exercises: Standing   Other Standing Knee Exercises  narrow BOS with 2# ball toss 3x10, tandem stance with ball toss 2#3 x10 each       Shoulder Exercises: Standing   Row  Strengthening;Right;20 reps;Theraband;10 reps   pink XTS   Diagonals  Strengthening;20 reps;Theraband    Theraband Level (Shoulder Diagonals)  --   wood chops     Shoulder Exercises: ROM/Strengthening   UBE (Upper Arm Bike)  60 RPM X8 MIN, 4 fwd, 4 bwd    Other ROM/Strengthening Exercises  4# ball diagonals RT and LT UE 3x10 each                  PT Long Term Goals - 02/20/18 1409      PT LONG TERM GOAL #1   Title  Patient will be independent with HEP     Time  6    Period  Weeks    Status  On-going      PT LONG TERM GOAL #2   Title  Patient will demonstrate 4+/5 or greater right shoulder MMT  in all planes to improve stability during functional tasks.    Time  6    Period  Weeks    Status  On-going   NT 02/20/18     PT LONG TERM GOAL #3   Title  Patient will demonstrate 4+/5 or greater right LE MMT in all planes to improve stability during functional tasks.     Time  6    Period  Weeks    Status  On-going   NT 02/20/18     PT LONG TERM GOAL #4   Title  Patient will report ability to perform functional tasks, toileting tasks, and ADLs independently.    Time  6    Period  Weeks    Status  Achieved   Patient able to perform all ADL's independently per reorted 02/20/18           Plan - 03/04/18 1427    Clinical Impression Statement  Pt arrived today doing fairly well and reports she can tell that she is getting stronger and her mm tone back. She was able to perform 690 steps in 10 mins on nustep today.     Clinical Impairments Affecting Rehab Potential  highly motivated and determined    PT Frequency  2x / week    PT Duration  6 weeks    PT Treatment/Interventions  ADLs/Self Care Home Management;Functional mobility training;Therapeutic activities;Therapeutic exercise;Balance training;Neuromuscular  re-education;Manual techniques;Passive range of motion;Patient/family education    PT Next Visit Plan  UE strengthening, postural exercises, functional UE exercises, advanced fine motor skills.    PT Home Exercise Plan  putty exercises yellow and orange    Consulted and Agree with Plan of Care  Patient       Patient will benefit from skilled therapeutic intervention in order to improve the following deficits and impairments:  Impaired UE functional use, Decreased strength, Decreased activity tolerance, Impaired sensation  Visit Diagnosis: Muscle weakness (generalized)     Problem List Patient Active Problem List   Diagnosis Date Noted  . Chest pain 01/24/2018  . Cerebrovascular accident (CVA) due to stenosis of left middle cerebral artery ([place]) 01/20/2018  . Meningioma ([place]) 08/06/2011    RAMSEUR,CHRIS, PTA 03/04/2018, 3:07 PM  [facility] [place], [place], [address] Phone: [phone]   Fax:  [phone]  Name: ANGELEA PENNY MRN: [id number] Date of Birth: [birth year]

## 2018-03-06 ENCOUNTER — Encounter: Payer: Medicare Other | Admitting: *Deleted

## 2018-03-07 ENCOUNTER — Other Ambulatory Visit: Payer: Self-pay | Admitting: Pediatrics

## 2018-03-07 DIAGNOSIS — N939 Abnormal uterine and vaginal bleeding, unspecified: Secondary | ICD-10-CM

## 2018-03-11 ENCOUNTER — Encounter: Payer: Self-pay | Admitting: Physical Therapy

## 2018-03-11 ENCOUNTER — Ambulatory Visit: Payer: Medicare Other | Admitting: Physical Therapy

## 2018-03-11 DIAGNOSIS — M6281 Muscle weakness (generalized): Secondary | ICD-10-CM

## 2018-03-11 NOTE — Therapy (Signed)
[facility] [place], [place], [address] Phone: [phone]   Fax:  [phone]  Physical Therapy Treatment  Patient Details  Name: Sheena Willis MRN: [id number] Date of Birth: April 28, 1970 Referring Provider (PT): Dr. Ala Bent   Encounter Date: 03/11/2018  PT End of Session - 03/11/18 1429    Visit Number  9    Number of Visits  12    Date for PT Re-Evaluation  03/19/18    Authorization Type  Progress note every 10th visit.    PT Start Time  1349    PT Stop Time  1430    PT Time Calculation (min)  41 min    Activity Tolerance  Patient tolerated treatment well    Behavior During Therapy  WFL for tasks assessed/performed       Past Medical History:  Diagnosis Date  . Brain tumor (benign) ([place])   . Radiation    for brain tumors  . Vertigo    due to benign brain tumor    Past Surgical History:  Procedure Laterality Date  . BRAIN BIOPSY  12/05/2009  . HALO APPLICATION     done before gamma knife radio surgery  . LESION REMOVAL Left 07/23/2012   Procedure: EXCISION SKIN NEOPLASM LEFT ELBOW;  Surgeon: Jamesetta So, MD;  Location: [facility];  Service: General;  Laterality: Left;  . MYRINGOTOMY WITH TUBE PLACEMENT Left    Dr. Benjamine Mola    There were no vitals filed for this visit.      [facility] PT Assessment - 03/11/18 0001      Assessment   Medical Diagnosis  CVA due to stenosis of left middle cerebral artery    Referring Provider (PT)  Dr. Ala Bent    Onset Date/Surgical Date  12/25/17    Hand Dominance  Right    Next MD Visit  02/07/2018    Prior Therapy  no                   [facility] Adult PT Treatment/Exercise - 03/11/18 0001      Knee/Hip Exercises: Aerobic   Elliptical  level 1 ramp 1 x4 mins      Knee/Hip Exercises: Machines for Strengthening   Cybex Knee Extension  20# 3x10    Cybex Knee Flexion  20# 3x10    Cybex Leg Press  2.5 plates 2x10      Knee/Hip Exercises: Standing   Other  Standing Knee Exercises  --    Other Standing Knee Exercises  bosu ball standing x2 minutes followed by bosu ball balance in mini squat with ball toss x2 mintues      Shoulder Exercises: Standing   Other Standing Exercises  wall clock with 4# medicine ball x10 each      Shoulder Exercises: ROM/Strengthening   UBE (Upper Arm Bike)  120 RPM 10 mins (71mins, fwd and bwd)                  PT Long Term Goals - 02/20/18 1409      PT LONG TERM GOAL #1   Title  Patient will be independent with HEP     Time  6    Period  Weeks    Status  On-going      PT LONG TERM GOAL #2   Title  Patient will demonstrate 4+/5 or greater right shoulder MMT in all planes to improve stability during functional tasks.    Time  6  Period  Weeks    Status  On-going   NT 02/20/18     PT LONG TERM GOAL #3   Title  Patient will demonstrate 4+/5 or greater right LE MMT in all planes to improve stability during functional tasks.     Time  6    Period  Weeks    Status  On-going   NT 02/20/18     PT LONG TERM GOAL #4   Title  Patient will report ability to perform functional tasks, toileting tasks, and ADLs independently.    Time  6    Period  Weeks    Status  Achieved   Patient able to perform all ADL's independently per reorted 02/20/18           Plan - 03/11/18 1435    Clinical Impression Statement  Patient was able to tolerate progression of treatment well and reported bosu balance was challenging. Patient performed bosu balance well and only required close supervision for balance activities. Patient was challenged by elliptical but was able to complete 4 minutes with reports of muscle fatigue. Progress note and assessment of goals required next visit.     Stability/Clinical Decision Making  Stable/Uncomplicated    Clinical Decision Making  Low    Rehab Potential  Excellent    Clinical Impairments Affecting Rehab Potential  highly motivated and determined    PT Frequency  2x / week    PT  Duration  6 weeks    PT Treatment/Interventions  ADLs/Self Care Home Management;Functional mobility training;Therapeutic activities;Therapeutic exercise;Balance training;Neuromuscular re-education;Manual techniques;Passive range of motion;Patient/family education    PT Next Visit Plan  PROGRESS NOTE Continue balance activities, UE strengthening, postural exercises, functional UE exercises, advanced fine motor skills.    Consulted and Agree with Plan of Care  Patient       Patient will benefit from skilled therapeutic intervention in order to improve the following deficits and impairments:  Impaired UE functional use, Decreased strength, Decreased activity tolerance, Impaired sensation  Visit Diagnosis: Muscle weakness (generalized)     Problem List Patient Active Problem List   Diagnosis Date Noted  . Chest pain 01/24/2018  . Cerebrovascular accident (CVA) due to stenosis of left middle cerebral artery ([place]) 01/20/2018  . Meningioma ([place]) 08/06/2011   Gabriela Eves, PT, DPT 03/11/2018, 2:40 PM  [facility] [address] [place], [place], [address] Phone: [phone]   Fax:  [phone]  Name: RUARI DUGGAN MRN: [id number] Date of Birth: [birth year]

## 2018-03-13 ENCOUNTER — Encounter: Payer: Self-pay | Admitting: Physical Therapy

## 2018-03-13 ENCOUNTER — Ambulatory Visit: Payer: Medicare Other | Admitting: Physical Therapy

## 2018-03-13 ENCOUNTER — Other Ambulatory Visit: Payer: Self-pay

## 2018-03-13 DIAGNOSIS — M6281 Muscle weakness (generalized): Secondary | ICD-10-CM

## 2018-03-13 NOTE — Therapy (Signed)
[facility] [place], [place], [address] Phone: [phone]   Fax:  [phone]  Physical Therapy Treatment  Progress Note Reporting Period 01/29/2018 to 03/13/2018  See note below for Objective Data and Assessment of Progress/Goals.  Goals are ongoing at this time but patient is progressing well. Re-certification sent to address ongoing deficits.  Gabriela Eves, PT, DPT    Patient Details  Name: Sheena Willis MRN: [id number] Date of Birth: [birth year] Referring Provider (PT): Dr. Ala Bent   Encounter Date: 03/13/2018  PT End of Session - 03/13/18 1416    Visit Number  10    Number of Visits  16    Date for PT Re-Evaluation  04/11/18    Authorization Type  Progress note every 10th visit.    PT Start Time  1359   56min late today   PT Stop Time  1440    PT Time Calculation (min)  41 min    Activity Tolerance  Patient tolerated treatment well    Behavior During Therapy  WFL for tasks assessed/performed       Past Medical History:  Diagnosis Date  . Brain tumor (benign) ([place])   . Radiation    for brain tumors  . Vertigo    due to benign brain tumor    Past Surgical History:  Procedure Laterality Date  . BRAIN BIOPSY  12/05/2009  . HALO APPLICATION     done before gamma knife radio surgery  . LESION REMOVAL Left 07/23/2012   Procedure: EXCISION SKIN NEOPLASM LEFT ELBOW;  Surgeon: Jamesetta So, MD;  Location: [facility];  Service: General;  Laterality: Left;  . MYRINGOTOMY WITH TUBE PLACEMENT Left    Dr. Benjamine Mola    There were no vitals filed for this visit.  Subjective Assessment - 03/13/18 1411    Subjective  Did well with last treatment    Pertinent History  History of strokes, 5 brain tumors (benign)    Limitations  House hold activities    Diagnostic tests  MRI: infarcts in left ACA/MCA (per ED documentation 01/18/2018)    Patient Stated Goals  "get back to as close to normal, be able to braid  hair"    Currently in Pain?  No/denies         [facility] PT Assessment - 03/13/18 0001      Strength   Strength Assessment Site  Shoulder;Hip;Knee    Right/Left Shoulder  Right    Right Shoulder Flexion  4/5    Right Shoulder ABduction  4/5    Right Shoulder Internal Rotation  4+/5    Right Shoulder External Rotation  4/5    Right/Left Hip  Right    Right Hip Flexion  4-/5    Right/Left Knee  Right    Right Knee Flexion  4/5    Right Knee Extension  4/5                   [facility] Adult PT Treatment/Exercise - 03/13/18 0001      Knee/Hip Exercises: Aerobic   Elliptical  level 1 ramp 1 x3 mins      Knee/Hip Exercises: Machines for Strengthening   Cybex Knee Extension  20# 3x10    Cybex Knee Flexion  20# 3x10    Cybex Leg Press  2.5 plates 2x10      Knee/Hip Exercises: Standing   Other Standing Knee Exercises  bosu ball standing x2 minutes followed by bosu ball balance in  mini squat with ball toss x2 mintues      Shoulder Exercises: Standing   Other Standing Exercises  wall clock with 4# medicine ball x10 each      Shoulder Exercises: ROM/Strengthening   UBE (Upper Arm Bike)  120 RPM 10 mins (36mins, fwd and bwd)                  PT Long Term Goals - 03/13/18 1437      PT LONG TERM GOAL #1   Title  Patient will be independent with HEP     Time  6    Period  Weeks    Status  On-going      PT LONG TERM GOAL #2   Title  Patient will demonstrate 4+/5 or greater right shoulder MMT in all planes to improve stability during functional tasks.    Time  6    Period  Weeks    Status  On-going   see assessment 03/13/18     PT LONG TERM GOAL #3   Title  Patient will demonstrate 4+/5 or greater right LE MMT in all planes to improve stability during functional tasks.     Time  6    Period  Weeks    Status  On-going   see assessment 03/13/18     PT LONG TERM GOAL #4   Title  Patient will report ability to perform functional tasks, toileting tasks, and ADLs  independently.    Time  6    Period  Weeks    Status  Achieved            Plan - 03/13/18 1506    Clinical Impression Statement  Patient tolerated treatment fairly well. Some fatigue noted with exercies yet able to complete. Patient has improved with overall strength in right UE/LE today. Patient progressing toward goals. Patient would like to continue a few visits.     Stability/Clinical Decision Making  Stable/Uncomplicated    Rehab Potential  Excellent    Clinical Impairments Affecting Rehab Potential  highly motivated and determined    PT Frequency  2x / week    PT Duration  6 weeks    PT Treatment/Interventions  ADLs/Self Care Home Management;Functional mobility training;Therapeutic activities;Therapeutic exercise;Balance training;Neuromuscular re-education;Manual techniques;Passive range of motion;Patient/family education    PT Next Visit Plan  Continue balance activities, UE strengthening, postural exercises, functional UE exercises, advanced fine motor skills.    Consulted and Agree with Plan of Care  Patient       Patient will benefit from skilled therapeutic intervention in order to improve the following deficits and impairments:  Impaired UE functional use, Decreased strength, Decreased activity tolerance, Impaired sensation  Visit Diagnosis: Muscle weakness (generalized)     Problem List Patient Active Problem List   Diagnosis Date Noted  . Chest pain 01/24/2018  . Cerebrovascular accident (CVA) due to stenosis of left middle cerebral artery ([place]) 01/20/2018  . Meningioma [facility]) 08/06/2011    Ladean Raya, PTA 03/13/18 4:42 PM  [facility] [place], [place], [address] Phone: [phone]   Fax:  [phone]  Name: Sheena Willis MRN: [id number] Date of Birth: July 13, 1970

## 2018-03-18 ENCOUNTER — Other Ambulatory Visit: Payer: Self-pay

## 2018-03-18 ENCOUNTER — Ambulatory Visit: Payer: Medicare Other | Admitting: Physical Therapy

## 2018-03-18 DIAGNOSIS — M6281 Muscle weakness (generalized): Secondary | ICD-10-CM | POA: Diagnosis not present

## 2018-03-18 NOTE — Therapy (Signed)
[facility] [place], [place], [address] Phone: [phone]   Fax:  [phone]  Physical Therapy Treatment  Patient Details  Name: Sheena Willis MRN: [id number] Date of Birth: 09-08-1970 Referring Provider (PT): Dr. Ala Bent   Encounter Date: 03/18/2018  PT End of Session - 03/18/18 1359    Visit Number  11    Number of Visits  16    Date for PT Re-Evaluation  04/11/18    Authorization Type  Progress note every 10th visit.    PT Start Time  1345    PT Stop Time  1428    PT Time Calculation (min)  43 min    Activity Tolerance  Patient tolerated treatment well    Behavior During Therapy  WFL for tasks assessed/performed       Past Medical History:  Diagnosis Date  . Brain tumor (benign) ([place])   . Radiation    for brain tumors  . Vertigo    due to benign brain tumor    Past Surgical History:  Procedure Laterality Date  . BRAIN BIOPSY  12/05/2009  . HALO APPLICATION     done before gamma knife radio surgery  . LESION REMOVAL Left 07/23/2012   Procedure: EXCISION SKIN NEOPLASM LEFT ELBOW;  Surgeon: Jamesetta So, MD;  Location: [facility];  Service: General;  Laterality: Left;  . MYRINGOTOMY WITH TUBE PLACEMENT Left    Dr. Benjamine Mola    There were no vitals filed for this visit.  Subjective Assessment - 03/18/18 1444    Subjective  Patient reports improvements with ADLs and was able to braid her hair but with increased time.    Pertinent History  History of strokes, 5 brain tumors (benign)    Limitations  House hold activities    Diagnostic tests  MRI: infarcts in left ACA/MCA (per ED documentation 01/18/2018)    Patient Stated Goals  "get back to as close to normal, be able to braid hair"    Currently in Pain?  No/denies         [facility] PT Assessment - 03/18/18 0001      Assessment   Medical Diagnosis  CVA due to stenosis of left middle cerebral artery                   [facility] Adult PT  Treatment/Exercise - 03/18/18 0001      Knee/Hip Exercises: Aerobic   Elliptical  level 1 ramp 1 x5 mins      Knee/Hip Exercises: Machines for Strengthening   Cybex Leg Press  2.5 plates 3x10      Knee/Hip Exercises: Standing   Forward Lunges  Both;1 set;10 reps    Other Standing Knee Exercises  lateral stepping yellow band in tiled hallway x2 yellow    Other Standing Knee Exercises  bosu ball standing x2 minutes followed by bosu ball balance in mini squat with ball toss x2 mintues with 2# med ball 20 seconds of cognitive task      Knee/Hip Exercises: Supine   Bridges  Strengthening;Both;2 sets;10 reps    Bridges Limitations  legs straight on red physioball    Other Supine Knee/Hip Exercises  bridges with hamstring curl x10      Shoulder Exercises: ROM/Strengthening   UBE (Upper Arm Bike)  120 RPM 10 mins (47mins, fwd and bwd)                  PT Long Term Goals - 03/13/18  Bridger #1   Title  Patient will be independent with HEP     Time  6    Period  Weeks    Status  On-going      PT LONG TERM GOAL #2   Title  Patient will demonstrate 4+/5 or greater right shoulder MMT in all planes to improve stability during functional tasks.    Time  6    Period  Weeks    Status  On-going   see assessment 03/13/18     PT LONG TERM GOAL #3   Title  Patient will demonstrate 4+/5 or greater right LE MMT in all planes to improve stability during functional tasks.     Time  6    Period  Weeks    Status  On-going   see assessment 03/13/18     PT LONG TERM GOAL #4   Title  Patient will report ability to perform functional tasks, toileting tasks, and ADLs independently.    Time  6    Period  Weeks    Status  Achieved            Plan - 03/18/18 1424    Clinical Impression Statement  Patient was able to tolerate progression of treatment well and has demonstrated good use of ankle and hip strategies for advance balance activities. Patient reported a  challenge with advanced supine core and balance activities but was able to demonstrate good form with all reps. Patient and PT discussed progression of exercises and provided with HEP. Patient reported understanding.     Stability/Clinical Decision Making  Stable/Uncomplicated    Clinical Decision Making  Low    Rehab Potential  Excellent    Clinical Impairments Affecting Rehab Potential  highly motivated and determined    PT Frequency  2x / week    PT Duration  6 weeks    PT Treatment/Interventions  ADLs/Self Care Home Management;Functional mobility training;Therapeutic activities;Therapeutic exercise;Balance training;Neuromuscular re-education;Manual techniques;Passive range of motion;Patient/family education    PT Next Visit Plan  Continue balance activities, UE strengthening, postural exercises, functional UE exercises, advanced fine motor skills.    Consulted and Agree with Plan of Care  Patient       Patient will benefit from skilled therapeutic intervention in order to improve the following deficits and impairments:  Impaired UE functional use, Decreased strength, Decreased activity tolerance, Impaired sensation  Visit Diagnosis: Muscle weakness (generalized)     Problem List Patient Active Problem List   Diagnosis Date Noted  . Chest pain 01/24/2018  . Cerebrovascular accident (CVA) due to stenosis of left middle cerebral artery ([place]) 01/20/2018  . Meningioma ([place]) 08/06/2011   Gabriela Eves, PT, DPT 03/18/2018, 4:08 PM  [facility] [address] [place], [place], [address] Phone: [phone]   Fax:  [phone]  Name: Sheena Willis MRN: [id number] Date of Birth: 01-Dec-1970

## 2018-03-20 ENCOUNTER — Encounter: Payer: Self-pay | Admitting: Physical Therapy

## 2018-03-20 ENCOUNTER — Ambulatory Visit: Payer: Medicare Other | Admitting: Physical Therapy

## 2018-03-20 ENCOUNTER — Other Ambulatory Visit: Payer: Self-pay

## 2018-03-20 DIAGNOSIS — M6281 Muscle weakness (generalized): Secondary | ICD-10-CM

## 2018-03-20 NOTE — Therapy (Addendum)
[facility] [place], [place], [address] Phone: [phone]   Fax:  [phone]  Physical Therapy Treatment  PHYSICAL THERAPY DISCHARGE SUMMARY  Visits from Start of Care: 12  Current functional level related to goals / functional outcomes: See below   Remaining deficits: See goals   Education / Equipment: HEP Plan: Patient agrees to discharge.  Patient goals were partially met. Patient is being discharged due to not returning since the last visit.  ?????  Gabriela Eves, PT, DPT 10/21/18   Patient Details  Name: Sheena Willis MRN: [id number] Date of Birth: 1970-02-16 Referring Provider (PT): Dr. Ala Bent   Encounter Date: 03/20/2018  PT End of Session - 03/20/18 1454    Visit Number  12    Number of Visits  16    Date for PT Re-Evaluation  04/11/18    Authorization Type  Progress note every 10th visit.    PT Start Time  1431    PT Stop Time  1511    PT Time Calculation (min)  40 min    Activity Tolerance  Patient tolerated treatment well    Behavior During Therapy  WFL for tasks assessed/performed       Past Medical History:  Diagnosis Date  . Brain tumor (benign) ([place])   . Radiation    for brain tumors  . Vertigo    due to benign brain tumor    Past Surgical History:  Procedure Laterality Date  . BRAIN BIOPSY  12/05/2009  . HALO APPLICATION     done before gamma knife radio surgery  . LESION REMOVAL Left 07/23/2012   Procedure: EXCISION SKIN NEOPLASM LEFT ELBOW;  Surgeon: Jamesetta So, MD;  Location: [facility];  Service: General;  Laterality: Left;  . MYRINGOTOMY WITH TUBE PLACEMENT Left    Dr. Benjamine Mola    There were no vitals filed for this visit.  Subjective Assessment - 03/20/18 1432    Subjective  No new complaints today    Pertinent History  History of strokes, 5 brain tumors (benign)    Limitations  House hold activities    Diagnostic tests  MRI: infarcts in left ACA/MCA (per ED  documentation 01/18/2018)    Patient Stated Goals  "get back to as close to normal, be able to braid hair"    Currently in Pain?  No/denies                       [facility] Adult PT Treatment/Exercise - 03/20/18 0001      Knee/Hip Exercises: Aerobic   Elliptical  level 1 ramp 1 x5 mins      Knee/Hip Exercises: Machines for Strengthening   Cybex Knee Extension  20# 3x10    Cybex Knee Flexion  20# 3x10    Cybex Leg Press  2.5 plates 3x10      Knee/Hip Exercises: Standing   Wall Squat  10 reps;1 set    Other Standing Knee Exercises  2# SLS woodchop x 5 each way      Knee/Hip Exercises: Prone   Other Prone Exercises  opp UE/LE for core and balance x5 each side      Shoulder Exercises: ROM/Strengthening   UBE (Upper Arm Bike)  120 RPM 10 mins (31mns, fwd and bwd)                  PT Long Term Goals - 03/13/18 1437      PT LONG TERM  GOAL #1   Title  Patient will be independent with HEP     Time  6    Period  Weeks    Status  On-going      PT LONG TERM GOAL #2   Title  Patient will demonstrate 4+/5 or greater right shoulder MMT in all planes to improve stability during functional tasks.    Time  6    Period  Weeks    Status  On-going   see assessment 03/13/18     PT LONG TERM GOAL #3   Title  Patient will demonstrate 4+/5 or greater right LE MMT in all planes to improve stability during functional tasks.     Time  6    Period  Weeks    Status  On-going   see assessment 03/13/18     PT LONG TERM GOAL #4   Title  Patient will report ability to perform functional tasks, toileting tasks, and ADLs independently.    Time  6    Period  Weeks    Status  Achieved            Plan - 03/20/18 1455    Clinical Impression Statement  Patient able to progress with exercises today. No pain reported only fatigue. Patient able to remain with good form just less reps today. Goals ongoing due to strength and endurance limitations.    Stability/Clinical Decision  Making  Stable/Uncomplicated    Rehab Potential  Excellent    Clinical Impairments Affecting Rehab Potential  highly motivated and determined    PT Frequency  2x / week    PT Duration  6 weeks    PT Treatment/Interventions  ADLs/Self Care Home Management;Functional mobility training;Therapeutic activities;Therapeutic exercise;Balance training;Neuromuscular re-education;Manual techniques;Passive range of motion;Patient/family education    PT Next Visit Plan  Continue balance activities, UE strengthening, postural exercises, functional UE exercises, advanced fine motor skills.    Consulted and Agree with Plan of Care  Patient       Patient will benefit from skilled therapeutic intervention in order to improve the following deficits and impairments:  Impaired UE functional use, Decreased strength, Decreased activity tolerance, Impaired sensation  Visit Diagnosis: Muscle weakness (generalized)     Problem List Patient Active Problem List   Diagnosis Date Noted  . Chest pain 01/24/2018  . Cerebrovascular accident (CVA) due to stenosis of left middle cerebral artery ([place]) 01/20/2018  . Meningioma ([place]) 08/06/2011    Pailynn Vahey P, PTA 03/20/2018, 3:11 PM  [facility] [place], [place], [address] Phone: [phone]   Fax:  [phone]  Name: Sheena Willis MRN: [id number] Date of Birth: 07/27/1970

## 2018-03-25 ENCOUNTER — Ambulatory Visit: Payer: Medicare Other | Admitting: *Deleted

## 2018-03-25 DIAGNOSIS — Z86011 Personal history of benign neoplasm of the brain: Secondary | ICD-10-CM | POA: Diagnosis not present

## 2018-03-25 DIAGNOSIS — R9431 Abnormal electrocardiogram [ECG] [EKG]: Secondary | ICD-10-CM | POA: Diagnosis not present

## 2018-03-25 DIAGNOSIS — Z8673 Personal history of transient ischemic attack (TIA), and cerebral infarction without residual deficits: Secondary | ICD-10-CM | POA: Diagnosis not present

## 2018-03-25 DIAGNOSIS — Z79899 Other long term (current) drug therapy: Secondary | ICD-10-CM | POA: Diagnosis not present

## 2018-03-25 DIAGNOSIS — R002 Palpitations: Secondary | ICD-10-CM | POA: Diagnosis not present

## 2018-03-25 DIAGNOSIS — Z7982 Long term (current) use of aspirin: Secondary | ICD-10-CM | POA: Diagnosis not present

## 2018-03-25 DIAGNOSIS — R931 Abnormal findings on diagnostic imaging of heart and coronary circulation: Secondary | ICD-10-CM | POA: Diagnosis not present

## 2018-03-26 DIAGNOSIS — R931 Abnormal findings on diagnostic imaging of heart and coronary circulation: Secondary | ICD-10-CM | POA: Diagnosis not present

## 2018-03-26 DIAGNOSIS — R002 Palpitations: Secondary | ICD-10-CM | POA: Diagnosis not present

## 2018-03-26 DIAGNOSIS — R9431 Abnormal electrocardiogram [ECG] [EKG]: Secondary | ICD-10-CM | POA: Diagnosis not present

## 2018-03-27 ENCOUNTER — Encounter: Payer: Medicare Other | Admitting: *Deleted

## 2018-04-04 ENCOUNTER — Telehealth: Payer: Self-pay | Admitting: Physical Therapy

## 2018-04-04 NOTE — Telephone Encounter (Signed)
Sheena Willis was contacted today regarding the temporary reduction of OP Rehab Services  due to concerns for community transmission of Covid-19.  Left voicemail for patient to give our office a call back.

## 2018-04-20 ENCOUNTER — Other Ambulatory Visit: Payer: Self-pay | Admitting: Physician Assistant

## 2018-04-20 DIAGNOSIS — E785 Hyperlipidemia, unspecified: Secondary | ICD-10-CM

## 2018-04-24 ENCOUNTER — Ambulatory Visit: Payer: Medicare Other

## 2018-05-05 DIAGNOSIS — D320 Benign neoplasm of cerebral meninges: Secondary | ICD-10-CM | POA: Diagnosis not present

## 2018-05-06 DIAGNOSIS — D329 Benign neoplasm of meninges, unspecified: Secondary | ICD-10-CM | POA: Diagnosis not present

## 2018-05-29 ENCOUNTER — Telehealth: Payer: Self-pay | Admitting: Physical Therapy

## 2018-05-29 NOTE — Telephone Encounter (Signed)
Sheena Willis was contacted in regards to resuming therapy. She stated she was doing well with her HEP and felt she could be discharged from therapy.

## 2018-06-02 ENCOUNTER — Other Ambulatory Visit: Payer: Self-pay | Admitting: Physician Assistant

## 2018-06-02 DIAGNOSIS — N939 Abnormal uterine and vaginal bleeding, unspecified: Secondary | ICD-10-CM

## 2018-06-19 ENCOUNTER — Ambulatory Visit (INDEPENDENT_AMBULATORY_CARE_PROVIDER_SITE_OTHER): Payer: Medicare Other

## 2018-06-19 ENCOUNTER — Other Ambulatory Visit: Payer: Self-pay

## 2018-06-19 DIAGNOSIS — N939 Abnormal uterine and vaginal bleeding, unspecified: Secondary | ICD-10-CM

## 2018-06-19 MED ORDER — MEDROXYPROGESTERONE ACETATE 150 MG/ML IM SUSP
150.0000 mg | Freq: Once | INTRAMUSCULAR | Status: AC
  Administered 2018-06-19: 150 mg via INTRAMUSCULAR

## 2018-07-02 DIAGNOSIS — Z7982 Long term (current) use of aspirin: Secondary | ICD-10-CM | POA: Diagnosis not present

## 2018-07-02 DIAGNOSIS — I63512 Cerebral infarction due to unspecified occlusion or stenosis of left middle cerebral artery: Secondary | ICD-10-CM | POA: Diagnosis not present

## 2018-07-02 DIAGNOSIS — I63511 Cerebral infarction due to unspecified occlusion or stenosis of right middle cerebral artery: Secondary | ICD-10-CM | POA: Diagnosis not present

## 2018-07-02 DIAGNOSIS — I69398 Other sequelae of cerebral infarction: Secondary | ICD-10-CM | POA: Diagnosis not present

## 2018-07-02 DIAGNOSIS — D320 Benign neoplasm of cerebral meninges: Secondary | ICD-10-CM | POA: Diagnosis not present

## 2018-07-02 DIAGNOSIS — I63232 Cerebral infarction due to unspecified occlusion or stenosis of left carotid arteries: Secondary | ICD-10-CM | POA: Diagnosis not present

## 2018-07-02 DIAGNOSIS — Z87891 Personal history of nicotine dependence: Secondary | ICD-10-CM | POA: Diagnosis not present

## 2018-07-02 DIAGNOSIS — Z79899 Other long term (current) drug therapy: Secondary | ICD-10-CM | POA: Diagnosis not present

## 2018-07-02 DIAGNOSIS — E785 Hyperlipidemia, unspecified: Secondary | ICD-10-CM | POA: Diagnosis not present

## 2018-07-25 ENCOUNTER — Ambulatory Visit: Payer: Medicare Other | Admitting: Physician Assistant

## 2018-08-12 ENCOUNTER — Ambulatory Visit (INDEPENDENT_AMBULATORY_CARE_PROVIDER_SITE_OTHER): Payer: Medicare Other | Admitting: Physician Assistant

## 2018-08-12 ENCOUNTER — Encounter: Payer: Self-pay | Admitting: Physician Assistant

## 2018-08-12 VITALS — BP 123/83

## 2018-08-12 DIAGNOSIS — Z1231 Encounter for screening mammogram for malignant neoplasm of breast: Secondary | ICD-10-CM

## 2018-08-12 DIAGNOSIS — F419 Anxiety disorder, unspecified: Secondary | ICD-10-CM

## 2018-08-12 MED ORDER — ALPRAZOLAM 0.25 MG PO TABS: ORAL_TABLET | ORAL | 1 refills | Status: DC

## 2018-08-12 MED ORDER — CITALOPRAM HYDROBROMIDE 40 MG PO TABS: 40.0000 mg | ORAL_TABLET | Freq: Every day | ORAL | 3 refills | Status: DC

## 2018-08-12 NOTE — Progress Notes (Signed)
Telephone visit  Subjective: [id number], anxiety PCP: Terald Sleeper, PA-C YOV:ZCHYI T Mano is a 48 y.o. female calls for telephone consult today. Patient provides verbal consent for consult held via phone.  Patient is identified with 2 separate identifiers.  At this time the entire area is on COVID-19 social distancing and stay home orders are in place.  Patient is of higher risk and therefore we are performing this by a virtual method.  Location of patient: home Location of provider: HOME Others present for call: no  Is having a recheck 6 months on her chronic medical conditions including depression anxiety.  In reviewing the PDMP she only filled the alprazolam one time in the past 6 months.  So at this time we will do a refill.  This is not really something that she needs to be under contract for her.  She does need refills on her citalopram.  And they will be sent.  In reviewing her labs they were great and do not need to be performed again until February.  We also talked about her needing a mammogram we will try to get one set up for her at [facility].   ROS: Per HPI  No Known Allergies Past Medical History:  Diagnosis Date  . Brain tumor (benign) ([place])   . Radiation    for brain tumors  . Vertigo    due to benign brain tumor    Current Outpatient Medications:  .  ALPRAZolam (XANAX) 0.25 MG tablet, No more than 2 times a day, Disp: 60 tablet, Rfl: 1 .  aspirin EC 81 MG tablet, Take by mouth., Disp: , Rfl:  .  atorvastatin (LIPITOR) 40 MG tablet, Take by mouth., Disp: , Rfl:  .  Cholecalciferol (VITAMIN D3) 1000 units CAPS, Take by mouth., Disp: , Rfl:  .  citalopram (CELEXA) 40 MG tablet, Take 1 tablet (40 mg total) by mouth daily., Disp: 90 tablet, Rfl: 3 .  clopidogrel (PLAVIX) 75 MG tablet, Take by mouth., Disp: , Rfl:  .  medroxyPROGESTERone Acetate 150 MG/ML SUSY, INJECT 1 ML (150 MG TOTAL) INTO THE MUSCLE EVERY 3 (THREE) MONTHS., Disp: 1 Syringe,  Rfl: 0 .  Multiple Vitamins-Minerals (MULTIVITAMINS THER. W/MINERALS) TABS, Take 1 tablet by mouth daily., Disp: , Rfl:  .  pravastatin (PRAVACHOL) 20 MG tablet, TAKE 1 TABLET BY MOUTH EVERY DAY, Disp: 90 tablet, Rfl: 3 .  vitamin C (ASCORBIC ACID) 500 MG tablet, Take 500 mg by mouth daily., Disp: , Rfl:   Assessment/ Plan: 48 y.o. female   1. Anxiety - ALPRAZolam (XANAX) 0.25 MG tablet; No more than 2 times a day  Dispense: 60 tablet; Refill: 1 - citalopram (CELEXA) 40 MG tablet; Take 1 tablet (40 mg total) by mouth daily.  Dispense: 90 tablet; Refill: 3  2. Encounter for screening mammogram for breast cancer - MM Digital Screening; Future   No follow-ups on file.  Continue all other maintenance medications as listed above.  Start time: 2:33 PM End time: 2:43 PM  Meds ordered this encounter  Medications  . ALPRAZolam (XANAX) 0.25 MG tablet    Sig: No more than 2 times a day    Dispense:  60 tablet    Refill:  1    Order Specific Question:   Supervising Provider    Answer:   Janora Norlander [[id number]]  . citalopram (CELEXA) 40 MG tablet    Sig: Take 1 tablet (40 mg total) by mouth daily.  Dispense:  90 tablet    Refill:  3    Order Specific Question:   Supervising Provider    Answer:   Janora Norlander [[id number]]    Particia Nearing PA-C [place] [phone]

## 2018-09-01 ENCOUNTER — Other Ambulatory Visit: Payer: Self-pay | Admitting: Physician Assistant

## 2018-09-01 DIAGNOSIS — N939 Abnormal uterine and vaginal bleeding, unspecified: Secondary | ICD-10-CM

## 2018-09-15 ENCOUNTER — Ambulatory Visit (INDEPENDENT_AMBULATORY_CARE_PROVIDER_SITE_OTHER): Payer: Medicare Other | Admitting: *Deleted

## 2018-09-15 DIAGNOSIS — Z0000 Encounter for general adult medical examination without abnormal findings: Secondary | ICD-10-CM | POA: Diagnosis not present

## 2018-09-15 NOTE — Patient Instructions (Signed)

## 2018-09-15 NOTE — Progress Notes (Signed)
MEDICARE ANNUAL WELLNESS VISIT  09/15/2018  Telephone Visit Disclaimer This Medicare AWV was conducted by telephone due to national recommendations for restrictions regarding the COVID-19 Pandemic (e.g. social distancing).  I verified, using two identifiers, that I am speaking with Sheena Willis or their authorized healthcare agent. I discussed the limitations, risks, security, and privacy concerns of performing an evaluation and management service by telephone and the potential availability of an in-person appointment in the future. The patient expressed understanding and agreed to proceed.   Subjective:  Sheena Willis is a 48 y.o. female patient of Terald Sleeper, PA-C who had a Medicare Annual Wellness Visit today via telephone. Sheena Willis is Disabled and lives with their son. she has 2 children. she reports that she is socially active and does interact with friends/family regularly. she is minimally physically active and enjoys yard work, Financial planner, sewing and spending time with her family.  Patient Care Team: Theodoro Clock as PCP - General (Physician Assistant)  Advanced Directives 09/15/2018 01/09/2018 07/16/2012  Does Patient Have a Medical Advance Directive? No No Patient does not have advance directive;Patient would not like information  Would patient like information on creating a medical advance directive? No - Patient declined No - Patient declined -  Pre-existing out of facility DNR order (yellow form or pink MOST form) - - No    Hospital Utilization Over the Past 12 Months: # of hospitalizations or ER visits: 3 # of surgeries: 0  Review of Systems    Patient reports that her overall health is unchanged compared to last year.  History obtained from chart review  Patient Reported Readings (BP, Pulse, CBG, Weight, etc) none  Pain Assessment Pain : No/denies pain     Current Medications & Allergies (verified) Allergies as of 09/15/2018   No Known Allergies      Medication List       Accurate as of September 15, 2018  1:47 PM. If you have any questions, ask your nurse or doctor.        STOP taking these medications   atorvastatin 40 MG tablet Commonly known as: LIPITOR   clopidogrel 75 MG tablet Commonly known as: PLAVIX     TAKE these medications   ALPRAZolam 0.25 MG tablet Commonly known as: XANAX No more than 2 times a day   aspirin EC 81 MG tablet Take by mouth.   citalopram 40 MG tablet Commonly known as: CELEXA Take 1 tablet (40 mg total) by mouth daily.   medroxyPROGESTERone Acetate 150 MG/ML Susy INJECT 1 ML (150 MG TOTAL) INTO THE MUSCLE EVERY 3 (THREE) MONTHS.   multivitamins ther. w/minerals Tabs tablet Take 1 tablet by mouth daily.   pravastatin 20 MG tablet Commonly known as: PRAVACHOL TAKE 1 TABLET BY MOUTH EVERY DAY   vitamin C 500 MG tablet Commonly known as: ASCORBIC ACID Take 500 mg by mouth daily.   Vitamin D3 25 MCG (1000 UT) Caps Take by mouth.       History (reviewed): Past Medical History:  Diagnosis Date  . Brain tumor (benign) ([place])   . Radiation    for brain tumors  . Vertigo    due to benign brain tumor   Past Surgical History:  Procedure Laterality Date  . BRAIN BIOPSY  12/05/2009  . HALO APPLICATION     done before gamma knife radio surgery  . LESION REMOVAL Left 07/23/2012   Procedure: EXCISION SKIN NEOPLASM LEFT ELBOW;  Surgeon: Jeannette How  Arnoldo Morale, MD;  Location: [facility];  Service: General;  Laterality: Left;  . MYRINGOTOMY WITH TUBE PLACEMENT Left    Dr. Benjamine Mola   Family History  Problem Relation Age of Onset  . Cancer Maternal Aunt   . Diabetes Maternal Aunt   . Cancer Maternal Aunt    Social History   Socioeconomic History  . Marital status: Divorced    Spouse name: Not on file  . Number of children: 2  . Years of education: 82  . Highest education level: High school graduate  Occupational History  . Occupation: disability  Social Needs  . Financial resource  strain: Not hard at all  . Food insecurity    Worry: Never true    Inability: Never true  . Transportation needs    Medical: No    Non-medical: No  Tobacco Use  . Smoking status: Former Smoker    Packs/day: 0.25    Types: Cigarettes    Quit date: 01/24/2018    Years since quitting: 0.6  . Smokeless tobacco: Never Used  Substance and Sexual Activity  . Alcohol use: No  . Drug use: No  . Sexual activity: Not Currently  Lifestyle  . Physical activity    Days per week: 7 days    Minutes per session: 30 min  . Stress: Not at all  Relationships  . Social connections    Talks on phone: More than three times a week    Gets together: More than three times a week    Attends religious service: More than 4 times per year    Active member of club or organization: Yes    Attends meetings of clubs or organizations: More than 4 times per year    Relationship status: Divorced  Other Topics Concern  . Not on file  Social History Narrative  . Not on file    Activities of Daily Living In your present state of health, do you have any difficulty performing the following activities: 09/15/2018  Hearing? Y  Comment can't hear as well with her left ear due to the hx of brain tumor  Vision? Y  Comment sometimes has issues with her right eye and needs to make an appointment with her eye doctor for an eye exam  Difficulty concentrating or making decisions? N  Walking or climbing stairs? N  Dressing or bathing? N  Doing errands, shopping? Y  Comment her son or mother always drives her to appointments and to do errands  Preparing Food and eating ? N  Using the Toilet? N  In the past six months, have you accidently leaked urine? N  Do you have problems with loss of bowel control? N  Managing your Medications? N  Managing your Finances? N  Housekeeping or managing your Housekeeping? 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   Depression Screen PHQ 2/9 Scores  09/15/2018 01/24/2018 01/08/2018 05/16/2017 04/24/2017 03/08/2017 09/05/2016  PHQ - 2 Score 0 0 0 0 0 0 0     Fall Risk Fall Risk  09/15/2018 01/08/2018 05/16/2017 04/24/2017 03/08/2017  Falls in the past year? 0 0 No No No  Number falls in past yr: 0 - - - -  Injury with Fall? 0 - - - -  Follow up Falls prevention discussed - - - -  Comment get rid of all throw rugs in the house, adequate lighting in walkways and grab bars in the bathroom - - - -     Objective:  Sheena Willis seemed alert and oriented and she participated appropriately during our telephone visit.  Blood Pressure Weight BMI  BP Readings from Last 3 Encounters:  08/12/18 123/83  02/19/18 105/68  01/24/18 107/71   Wt Readings from Last 3 Encounters:  01/24/18 141 lb 3.2 oz (64 kg)  01/16/18 146 lb (66.2 kg)  01/09/18 145 lb (65.8 kg)   BMI Readings from Last 1 Encounters:  01/24/18 22.79 kg/m    *Unable to obtain current vital signs, weight, and BMI due to telephone visit type  Hearing/Vision  . Sheena Willis did not seem to have difficulty with hearing/understanding during the telephone conversation . Reports that she has not had a formal eye exam by an eye care professional within the past year . Reports that she has not had a formal hearing evaluation within the past year *Unable to fully assess hearing and  vision during telephone visit type  Cognitive Function: 6CIT Screen 09/15/2018  What Year? 0 points  What month? 0 points  What time? 0 points  Count back from 20 0 points  Months in reverse 0 points  Repeat phrase 0 points  Total Score 0   (Normal:0-7, Significant for Dysfunction: >8)  Normal Cognitive Function Screening: Yes   Immunization & Health Maintenance Record Immunization History  Administered Date(s) Administered  . MMR 06/18/1996  . Td 03/21/1989, 04/25/1994, 01/31/2005    Health Maintenance  Topic Date Due  . URINE MICROALBUMIN  07/28/1980  . HIV Screening  07/28/1985  . TETANUS/TDAP  02/01/2015  . PAP SMEAR-Modifier  10/20/2018  . INFLUENZA VACCINE  Discontinued       Assessment  This is a routine wellness examination for Sheena Willis.  Health Maintenance: Due or Overdue Health Maintenance Due  Topic Date Due  . URINE MICROALBUMIN  07/28/1980  . HIV Screening  07/28/1985  . TETANUS/TDAP  02/01/2015  . PAP SMEAR-Modifier  10/20/2018    Sheena Willis does not need a referral for Community Assistance: Care Management:   no Social Work:    no Prescription Assistance:  no Nutrition/Diabetes Education:  no   Plan:  Personalized Goals Goals Addressed            This Visit's Progress   . DIET - INCREASE WATER INTAKE       Try to drink 6-8 glasses of water daily.      Personalized Health Maintenance & Screening Recommendations  Influenza vaccine Td vaccine  Lung Cancer Screening Recommended: no (Low Dose CT Chest recommended if Age 86-80 years, 30 pack-year currently smoking OR have quit w/in past 15 years) Hepatitis C Screening recommended: no HIV Screening recommended: no  Advanced Directives: Written information was not prepared per patient's request.  Referrals & Orders No orders of the defined types were placed in this encounter.   Follow-up Plan . Follow-up with Terald Sleeper, PA-C as planned .  Consider Flu and TDAP  vaccines at your next visit with your PCP   I have personally reviewed and noted the following in the patient's chart:   . Medical and social history . Use of alcohol, tobacco or illicit drugs  . Current medications and supplements . Functional ability and status . Nutritional status . Physical activity . Advanced directives . List of other physicians . Hospitalizations, surgeries, and ER visits in previous 12 months . Vitals . Screenings to include cognitive, depression, and falls . Referrals and appointments  In addition, I have reviewed and discussed with Sheena Willis certain preventive protocols, quality metrics, and best practice recommendations. A written personalized care plan for preventive services as well as general preventive health recommendations is available and can be mailed to the patient at her request.      Marylin Crosby, LPN  [id number]

## 2018-09-16 ENCOUNTER — Ambulatory Visit (INDEPENDENT_AMBULATORY_CARE_PROVIDER_SITE_OTHER): Payer: Medicare Other | Admitting: *Deleted

## 2018-09-16 ENCOUNTER — Other Ambulatory Visit: Payer: Self-pay

## 2018-09-16 DIAGNOSIS — Z23 Encounter for immunization: Secondary | ICD-10-CM

## 2018-09-16 DIAGNOSIS — Z3042 Encounter for surveillance of injectable contraceptive: Secondary | ICD-10-CM

## 2018-09-16 MED ORDER — MEDROXYPROGESTERONE ACETATE 150 MG/ML IM SUSP
150.0000 mg | Freq: Once | INTRAMUSCULAR | Status: AC
  Administered 2018-09-16: 150 mg via INTRAMUSCULAR

## 2018-09-16 NOTE — Progress Notes (Signed)
Pt given Tdap and Medroxyprogesterone inj Tolerated well

## 2018-10-28 DIAGNOSIS — I639 Cerebral infarction, unspecified: Secondary | ICD-10-CM | POA: Diagnosis not present

## 2018-11-05 DIAGNOSIS — H524 Presbyopia: Secondary | ICD-10-CM | POA: Diagnosis not present

## 2018-11-05 DIAGNOSIS — H5203 Hypermetropia, bilateral: Secondary | ICD-10-CM | POA: Diagnosis not present

## 2018-11-05 DIAGNOSIS — H52223 Regular astigmatism, bilateral: Secondary | ICD-10-CM | POA: Diagnosis not present

## 2018-11-05 DIAGNOSIS — H25813 Combined forms of age-related cataract, bilateral: Secondary | ICD-10-CM | POA: Diagnosis not present

## 2018-12-07 ENCOUNTER — Other Ambulatory Visit: Payer: Self-pay | Admitting: Physician Assistant

## 2018-12-07 DIAGNOSIS — N939 Abnormal uterine and vaginal bleeding, unspecified: Secondary | ICD-10-CM

## 2018-12-08 ENCOUNTER — Ambulatory Visit: Payer: Medicare Other

## 2018-12-10 ENCOUNTER — Other Ambulatory Visit: Payer: Self-pay

## 2018-12-10 ENCOUNTER — Telehealth: Payer: Self-pay | Admitting: Physician Assistant

## 2018-12-10 ENCOUNTER — Ambulatory Visit (HOSPITAL_COMMUNITY)
Admission: RE | Admit: 2018-12-10 | Discharge: 2018-12-10 | Disposition: A | Discharge status: Home / Self Care | Payer: Medicare Other | Source: Ambulatory Visit | Attending: Physician Assistant | Admitting: Physician Assistant

## 2018-12-10 ENCOUNTER — Encounter (HOSPITAL_COMMUNITY): Payer: Self-pay

## 2018-12-10 DIAGNOSIS — Z1231 Encounter for screening mammogram for malignant neoplasm of breast: Secondary | ICD-10-CM | POA: Insufficient documentation

## 2018-12-10 NOTE — Telephone Encounter (Signed)
Patient went for screening mammogram and informed the tech that she is having tenderness and was advised to contact our office to change order to diagnostic mammogram.  Spoke to the patient she states that she is having tenderness under the left arm. She has had this off and on for years and each time it lasts for a couple of weeks.  Patient has not had a mammogram done at the time of tenderness.  Please advise if diagnostic mammogram is appropriate or if patient needs an OV.

## 2018-12-11 ENCOUNTER — Other Ambulatory Visit: Payer: Self-pay | Admitting: Physician Assistant

## 2018-12-11 DIAGNOSIS — N644 Mastodynia: Secondary | ICD-10-CM

## 2018-12-11 DIAGNOSIS — Z1231 Encounter for screening mammogram for malignant neoplasm of breast: Secondary | ICD-10-CM

## 2018-12-11 NOTE — Telephone Encounter (Signed)
New order has been placed. 

## 2018-12-12 ENCOUNTER — Other Ambulatory Visit: Payer: Self-pay

## 2018-12-12 DIAGNOSIS — N644 Mastodynia: Secondary | ICD-10-CM

## 2018-12-12 NOTE — Progress Notes (Signed)
Per [place] orders need to be placed in case the patient needs at time of appointment.

## 2018-12-29 ENCOUNTER — Telehealth: Payer: Self-pay | Admitting: Physician Assistant

## 2018-12-29 NOTE — Telephone Encounter (Signed)
FYI

## 2018-12-30 ENCOUNTER — Ambulatory Visit (HOSPITAL_COMMUNITY): Payer: Medicare Other

## 2018-12-30 ENCOUNTER — Encounter (HOSPITAL_COMMUNITY): Payer: Medicare Other

## 2018-12-30 ENCOUNTER — Ambulatory Visit (HOSPITAL_COMMUNITY): Admission: RE | Admit: 2018-12-30 | Payer: Medicare Other | Source: Ambulatory Visit

## 2018-12-30 NOTE — Telephone Encounter (Signed)
Has she had her annual mammogram done? This should be done annually after age 48.

## 2018-12-31 NOTE — Telephone Encounter (Signed)
Patient aware and states that she has not had her annual mammogram. States she will schedule it.

## 2019-02-09 ENCOUNTER — Telehealth: Payer: Self-pay | Admitting: Physician Assistant

## 2019-02-09 NOTE — Telephone Encounter (Signed)
Explained to patient I had contacted [facility] and they reported that Dr. Renard Matter had prescribed Atorvastatin (90 day) on 10/28/18 and also on 01/31/2019 (90 day).  Patient reports this is the doctor she sees since having a stroke.  I advised patient she should contact their office to determine which medication she should be taking.  Patient agrees.

## 2019-02-17 ENCOUNTER — Telehealth: Payer: Self-pay | Admitting: Physician Assistant

## 2019-02-17 NOTE — Chronic Care Management (AMB) (Signed)
  Chronic Care Management   Note  02/17/2019 Name: Sheena Willis MRN: [id number] DOB: 06-29-1970  Sheena Willis is a 49 y.o. year old female who is a primary care patient of Terald Sleeper, PA-C. I reached out to Sheena Willis by phone today in response to a referral sent by Sheena Willis's health plan.     [name] was given information about Chronic Care Management services today including:  1. CCM service includes personalized support from designated clinical staff supervised by her physician, including individualized plan of care and coordination with other care providers 2. 24/7 contact phone numbers for assistance for urgent and routine care needs. 3. Service will only be billed when office clinical staff spend 20 minutes or more in a month to coordinate care. 4. Only one practitioner may furnish and bill the service in a calendar month. 5. The patient may stop CCM services at any time (effective at the end of the month) by phone call to the office staff. 6. The patient will be responsible for cost sharing (co-pay) of up to 20% of the service fee (after annual deductible is met).  Patient did not agree to enrollment in care management services and does not wish to consider at this time.  Follow up plan: The care management team is available to follow up with the patient after provider conversation with the patient regarding recommendation for care management engagement and subsequent re-referral to the care management team.   Noreene Larsson, [address], [place], [place] [address] Direct Dial: [phone] [email or web address] Website: [email or web address]

## 2019-03-03 ENCOUNTER — Telehealth: Payer: Self-pay | Admitting: Physician Assistant

## 2019-03-03 NOTE — Telephone Encounter (Signed)
Please advise 

## 2019-03-04 NOTE — Telephone Encounter (Signed)
Lmtcb.

## 2019-03-04 NOTE — Telephone Encounter (Signed)
You ccan take ibuprofen 800 mg and tylenol 1000 mg at the same time.  You can do on infrequent basis.

## 2019-03-04 NOTE — Telephone Encounter (Signed)
Pt returned missed call. Made pt aware of what Glenard Haring recommended to take on infrequent basis. Pt voiced understanding. (ibuprofen 800mg  and tylenol 1000mg )

## 2019-04-07 ENCOUNTER — Other Ambulatory Visit (HOSPITAL_COMMUNITY): Payer: Self-pay | Admitting: Physician Assistant

## 2019-04-07 DIAGNOSIS — Z1231 Encounter for screening mammogram for malignant neoplasm of breast: Secondary | ICD-10-CM

## 2019-04-20 ENCOUNTER — Ambulatory Visit (HOSPITAL_COMMUNITY): Payer: Medicare Other

## 2019-04-23 ENCOUNTER — Other Ambulatory Visit: Payer: Self-pay | Admitting: *Deleted

## 2019-04-23 DIAGNOSIS — F419 Anxiety disorder, unspecified: Secondary | ICD-10-CM

## 2019-04-28 ENCOUNTER — Other Ambulatory Visit: Payer: Self-pay | Admitting: *Deleted

## 2019-04-28 DIAGNOSIS — F419 Anxiety disorder, unspecified: Secondary | ICD-10-CM

## 2019-04-30 ENCOUNTER — Encounter: Payer: Self-pay | Admitting: Family

## 2019-04-30 ENCOUNTER — Ambulatory Visit (INDEPENDENT_AMBULATORY_CARE_PROVIDER_SITE_OTHER): Payer: Medicare Other | Admitting: Family

## 2019-04-30 DIAGNOSIS — I6990 Unspecified sequelae of unspecified cerebrovascular disease: Secondary | ICD-10-CM

## 2019-04-30 DIAGNOSIS — E785 Hyperlipidemia, unspecified: Secondary | ICD-10-CM

## 2019-04-30 DIAGNOSIS — Q2112 Patent foramen ovale: Secondary | ICD-10-CM

## 2019-04-30 DIAGNOSIS — Q211 Atrial septal defect: Secondary | ICD-10-CM

## 2019-04-30 DIAGNOSIS — F419 Anxiety disorder, unspecified: Secondary | ICD-10-CM

## 2019-04-30 DIAGNOSIS — D329 Benign neoplasm of meninges, unspecified: Secondary | ICD-10-CM

## 2019-04-30 DIAGNOSIS — K047 Periapical abscess without sinus: Secondary | ICD-10-CM

## 2019-04-30 DIAGNOSIS — I6930 Unspecified sequelae of cerebral infarction: Secondary | ICD-10-CM

## 2019-04-30 DIAGNOSIS — F411 Generalized anxiety disorder: Secondary | ICD-10-CM

## 2019-04-30 DIAGNOSIS — I63512 Cerebral infarction due to unspecified occlusion or stenosis of left middle cerebral artery: Secondary | ICD-10-CM

## 2019-04-30 DIAGNOSIS — H9192 Unspecified hearing loss, left ear: Secondary | ICD-10-CM

## 2019-04-30 HISTORY — DX: Unspecified sequelae of cerebral infarction: I69.30

## 2019-04-30 HISTORY — DX: Generalized anxiety disorder: F41.1

## 2019-04-30 MED ORDER — CITALOPRAM HYDROBROMIDE 40 MG PO TABS: 40.0000 mg | ORAL_TABLET | Freq: Every day | ORAL | 3 refills | Status: DC

## 2019-04-30 MED ORDER — ALPRAZOLAM 0.25 MG PO TABS: 0.2500 mg | ORAL_TABLET | Freq: Every evening | ORAL | 1 refills | Status: DC | PRN

## 2019-04-30 MED ORDER — PRAVASTATIN SODIUM 20 MG PO TABS: 20.0000 mg | ORAL_TABLET | Freq: Every day | ORAL | 3 refills | Status: DC

## 2019-04-30 NOTE — Progress Notes (Signed)
Virtual Visit via telephone Note Due to COVID-19 pandemic this visit was conducted virtually. This visit type was conducted due to national recommendations for restrictions regarding the COVID-19 Pandemic (e.g. social distancing, sheltering in place) in an effort to limit this patient's exposure and mitigate transmission in our community. All issues noted in this document were discussed and addressed.  A physical exam was not performed with this format.  I connected with Sheena Willis on 04/30/19 at 11: 25 AM by telephone and verified that I am speaking with the correct person using two identifiers. Sheena Willis is currently located at home and no one is currently with her during visit. The provider, Evelina Dun, FNP is located in their office at time of visit.  I discussed the limitations, risks, security and privacy concerns of performing an evaluation and management service by telephone and the availability of in person appointments. I also discussed with the patient that there may be a patient responsible charge related to this service. The patient expressed understanding and agreed to proceed.   History and Present Illness:  PT calls the office today for chronic follow up. She is followed by Neurologists every 6 months for Hx CVA with right sided weakness and hx meningioma. She is followed by Cardiologists PFO. She reports she is has an abscess tooth that she noticed last month on her top right tooth.  Anxiety Presents for follow-up visit. Symptoms include depressed mood, excessive worry, irritability, nervous/anxious behavior, panic and restlessness. Symptoms occur most days. The severity of symptoms is moderate. The quality of sleep is good.    Hyperlipidemia This is a chronic problem. The current episode started more than 1 year ago. The problem is uncontrolled. Recent lipid tests were reviewed and are high. Exacerbating diseases include obesity. Current antihyperlipidemic  treatment includes statins. The current treatment provides mild improvement of lipids.      Review of Systems  Constitutional: Positive for irritability.  Psychiatric/Behavioral: The patient is nervous/anxious.   All other systems reviewed and are negative.    Observations/Objective: No SOB or distress noted   Assessment and Plan: Sheena Willis comes in today with chief complaint of No chief complaint on file.   Diagnosis and orders addressed:  1. Late effects of CVA (cerebrovascular accident) - Ambulatory referral to Neurology  2. Meningioma [facility]) - Ambulatory referral to Neurology - Ambulatory referral to ENT  3. Cerebrovascular accident (CVA) due to stenosis of left middle cerebral artery ([place])  4. GAD (generalized anxiety disorder) - ALPRAZolam (XANAX) 0.25 MG tablet; Take 1 tablet (0.25 mg total) by mouth at bedtime as needed for anxiety. No more than 2 times a day  Dispense: 30 tablet; Refill: 1 - citalopram (CELEXA) 40 MG tablet; Take 1 tablet (40 mg total) by mouth daily.  Dispense: 90 tablet; Refill: 3  5. Hyperlipidemia, unspecified hyperlipidemia type - pravastatin (PRAVACHOL) 20 MG tablet; Take 1 tablet (20 mg total) by mouth daily.  Dispense: 90 tablet; Refill: 3  6. PFO (patent foramen ovale) - Ambulatory referral to Cardiology  7. Abscessed tooth - Ambulatory referral to Dentistry  8. Decreased hearing of left ear - Ambulatory referral to ENT  9. Anxiety Pt reviewed in  controlled database- No red flags noted, have not refilled since 08/12/18 - ALPRAZolam (XANAX) 0.25 MG tablet; Take 1 tablet (0.25 mg total) by mouth at bedtime as needed for anxiety. No more than 2 times a day  Dispense: 30 tablet; Refill: 1 - citalopram (CELEXA) 40 MG  tablet; Take 1 tablet (40 mg total) by mouth daily.  Dispense: 90 tablet; Refill: 3   Health Maintenance reviewed Diet and exercise encouraged  Follow up plan: CPE with pap on 05/25/19     I discussed the  assessment and treatment plan with the patient. The patient was provided an opportunity to ask questions and all were answered. The patient agreed with the plan and demonstrated an understanding of the instructions.   The patient was advised to call back or seek an in-person evaluation if the symptoms worsen or if the condition fails to improve as anticipated.  The above assessment and management plan was discussed with the patient. The patient verbalized understanding of and has agreed to the management plan. Patient is aware to call the clinic if symptoms persist or worsen. Patient is aware when to return to the clinic for a follow-up visit. Patient educated on when it is appropriate to go to the emergency department.   Time call ended:  11:55 pm  I provided 30 minutes of non-face-to-face time during this encounter.    Evelina Dun, FNP

## 2019-05-06 DIAGNOSIS — I6522 Occlusion and stenosis of left carotid artery: Secondary | ICD-10-CM | POA: Diagnosis not present

## 2019-05-06 DIAGNOSIS — G9389 Other specified disorders of brain: Secondary | ICD-10-CM | POA: Diagnosis not present

## 2019-05-06 DIAGNOSIS — D329 Benign neoplasm of meninges, unspecified: Secondary | ICD-10-CM | POA: Diagnosis not present

## 2019-05-06 DIAGNOSIS — I252 Old myocardial infarction: Secondary | ICD-10-CM | POA: Diagnosis not present

## 2019-05-25 ENCOUNTER — Ambulatory Visit (INDEPENDENT_AMBULATORY_CARE_PROVIDER_SITE_OTHER): Payer: Medicare Other | Admitting: Family

## 2019-05-25 ENCOUNTER — Encounter: Payer: Self-pay | Admitting: Family

## 2019-05-25 ENCOUNTER — Other Ambulatory Visit: Payer: Self-pay

## 2019-05-25 VITALS — BP 109/66 | HR 58 | Temp 98.4°F | Ht 66.0 in | Wt 148.6 lb

## 2019-05-25 DIAGNOSIS — Z9114 Patient's other noncompliance with medication regimen: Secondary | ICD-10-CM | POA: Insufficient documentation

## 2019-05-25 DIAGNOSIS — F1320 Sedative, hypnotic or anxiolytic dependence, uncomplicated: Secondary | ICD-10-CM | POA: Insufficient documentation

## 2019-05-25 DIAGNOSIS — Z0001 Encounter for general adult medical examination with abnormal findings: Secondary | ICD-10-CM | POA: Diagnosis not present

## 2019-05-25 DIAGNOSIS — I6990 Unspecified sequelae of unspecified cerebrovascular disease: Secondary | ICD-10-CM

## 2019-05-25 DIAGNOSIS — Z01419 Encounter for gynecological examination (general) (routine) without abnormal findings: Secondary | ICD-10-CM

## 2019-05-25 DIAGNOSIS — Z7689 Persons encountering health services in other specified circumstances: Secondary | ICD-10-CM | POA: Diagnosis not present

## 2019-05-25 DIAGNOSIS — F411 Generalized anxiety disorder: Secondary | ICD-10-CM

## 2019-05-25 DIAGNOSIS — Z79899 Other long term (current) drug therapy: Secondary | ICD-10-CM

## 2019-05-25 DIAGNOSIS — D329 Benign neoplasm of meninges, unspecified: Secondary | ICD-10-CM | POA: Diagnosis not present

## 2019-05-25 DIAGNOSIS — Z0000 Encounter for general adult medical examination without abnormal findings: Secondary | ICD-10-CM | POA: Diagnosis not present

## 2019-05-25 MED ORDER — ALPRAZOLAM 0.25 MG PO TABS: 0.2500 mg | ORAL_TABLET | Freq: Every day | ORAL | 5 refills | Status: DC

## 2019-05-25 NOTE — Progress Notes (Signed)
Subjective:    Patient ID: Sheena Willis, female    DOB: June 12, 1970, 49 y.o.   MRN: [id number]  Chief Complaint  Patient presents with  . Annual Exam    with pap   PT presents to the office today for CPE with pap. She is followed by Neurologists every 6 months for Hx CVA with right sided weakness and hx meningioma. She is followed by Cardiologists PFO.  Hyperlipidemia This is a chronic problem. The current episode started more than 1 year ago. The problem is controlled. Recent lipid tests were reviewed and are normal. Current antihyperlipidemic treatment includes statins. The current treatment provides moderate improvement of lipids. Risk factors for coronary artery disease include dyslipidemia and a sedentary lifestyle.  Anxiety Presents for follow-up visit. Symptoms include depressed mood, excessive worry, irritability, nervous/anxious behavior and restlessness. Symptoms occur most days. The severity of symptoms is moderate. The quality of sleep is good.    Gynecologic Exam The patient's pertinent negatives include no genital itching, genital lesions, missed menses, vaginal bleeding or vaginal discharge.      Review of Systems  Constitutional: Positive for irritability.  Genitourinary: Negative for missed menses and vaginal discharge.  Psychiatric/Behavioral: The patient is nervous/anxious.   All other systems reviewed and are negative.  Family History  Problem Relation Age of Onset  . Cancer Maternal Aunt   . Diabetes Maternal Aunt   . Cancer Maternal Aunt    Social History   Socioeconomic History  . Marital status: Divorced    Spouse name: Not on file  . Number of children: 2  . Years of education: 17  . Highest education level: High school graduate  Occupational History  . Occupation: disability  Tobacco Use  . Smoking status: Former Smoker    Packs/day: 0.25    Types: Cigarettes    Quit date: 01/24/2018    Years since quitting: 1.3  . Smokeless tobacco:  Never Used  Substance and Sexual Activity  . Alcohol use: No  . Drug use: No  . Sexual activity: Not Currently  Other Topics Concern  . Not on file  Social History Narrative  . Not on file   Social Determinants of Health   Financial Resource Strain: Low Risk   . Difficulty of Paying Living Expenses: Not hard at all  Food Insecurity: No Food Insecurity  . Worried About Charity fundraiser in the Last Year: Never true  . Ran Out of Food in the Last Year: Never true  Transportation Needs: No Transportation Needs  . Lack of Transportation (Medical): No  . Lack of Transportation (Non-Medical): No  Physical Activity: Sufficiently Active  . Days of Exercise per Week: 7 days  . Minutes of Exercise per Session: 30 min  Stress: No Stress Concern Present  . Feeling of Stress : Not at all  Social Connections: Slightly Isolated  . Frequency of Communication with Friends and Family: More than three times a week  . Frequency of Social Gatherings with Friends and Family: More than three times a week  . Attends Religious Services: More than 4 times per year  . Active Member of Clubs or Organizations: Yes  . Attends Archivist Meetings: More than 4 times per year  . Marital Status: Divorced       Objective:   Physical Exam Vitals reviewed.  Constitutional:      General: She is not in acute distress.    Appearance: She is well-developed.  HENT:  Head: Normocephalic and atraumatic.     Right Ear: Tympanic membrane normal.     Left Ear: Tympanic membrane normal.  Eyes:     Pupils: Pupils are equal, round, and reactive to light.  Neck:     Thyroid: No thyromegaly.  Cardiovascular:     Rate and Rhythm: Normal rate and regular rhythm.     Heart sounds: Normal heart sounds. No murmur.  Pulmonary:     Effort: Pulmonary effort is normal. No respiratory distress.     Breath sounds: Normal breath sounds. No wheezing.  Chest:     Breasts:        Right: No swelling, bleeding,  inverted nipple, mass, nipple discharge, skin change or tenderness.        Left: No swelling, bleeding, inverted nipple, mass, nipple discharge, skin change or tenderness.  Abdominal:     General: Bowel sounds are normal. There is no distension.     Palpations: Abdomen is soft.     Tenderness: There is no abdominal tenderness.  Genitourinary:    General: Normal vulva.     Comments: Bimanual exam- no adnexal masses or tenderness, ovaries nonpalpable   Cervix parous and pink- No discharge  Musculoskeletal:        General: No tenderness. Normal range of motion.     Cervical back: Normal range of motion and neck supple.  Skin:    General: Skin is warm and dry.  Neurological:     Mental Status: She is alert and oriented to person, place, and time.     Cranial Nerves: No cranial nerve deficit.     Deep Tendon Reflexes: Reflexes are normal and symmetric.  Psychiatric:        Mood and Affect: Mood is anxious.        Behavior: Behavior normal.        Thought Content: Thought content normal.        Judgment: Judgment normal.     BP 109/66   Pulse (!) 58   Temp 98.4 F (36.9 C) (Temporal)   Ht '5\' 6"'$  (1.676 m)   Wt 148 lb 9.6 oz (67.4 kg)   SpO2 98%   BMI 23.98 kg/m        Assessment & Plan:  ANAIKA Willis comes in today with chief complaint of Annual Exam (with pap)   Diagnosis and orders addressed:  1. Annual physical exam - CMP14+EGFR - CBC with Differential/Platelet - Lipid panel - TSH - IGP, Aptima HPV, rfx 16/18,45 - STD Screen (8) - Chlamydia/Gonococcus/Trichomonas, NAA  2. Encounter for gynecological examination without abnormal finding - CMP14+EGFR - CBC with Differential/Platelet - IGP, Aptima HPV, rfx 16/18,45  3. Meningioma (HCC) - CMP14+EGFR - CBC with Differential/Platelet  4. Late effects of CVA (cerebrovascular accident) - CMP14+EGFR - CBC with Differential/Platelet  5. GAD (generalized anxiety disorder) - CMP14+EGFR - CBC with  Differential/Platelet - ALPRAZolam (XANAX) 0.25 MG tablet; Take 1 tablet (0.25 mg total) by mouth at bedtime. No more than 2 times a day  Dispense: 30 tablet; Refill: 5  6. Encounter for assessment of STD exposure - CMP14+EGFR - CBC with Differential/Platelet - STD Screen (8) - Chlamydia/Gonococcus/Trichomonas, NAA  7. Controlled substance agreement signed - ALPRAZolam (XANAX) 0.25 MG tablet; Take 1 tablet (0.25 mg total) by mouth at bedtime. No more than 2 times a day  Dispense: 30 tablet; Refill: 5 - ToxASSURE Select 13 (MW), Urine  8. Benzodiazepine dependence (HCC) - ALPRAZolam (XANAX) 0.25 MG tablet; Take 1  tablet (0.25 mg total) by mouth at bedtime. No more than 2 times a day  Dispense: 30 tablet; Refill: 5 - ToxASSURE Select 13 (MW), Urine  Pt reviewed in [place] controlled database- No red flags, contract and drug screen up dated today Labs pending Health Maintenance reviewed Diet and exercise encouraged  Follow up plan: 6 months    Evelina Dun, FNP

## 2019-05-25 NOTE — Patient Instructions (Signed)
Health Maintenance, Female Adopting a healthy lifestyle and getting preventive care are important in promoting health and wellness. Ask your health care provider about:  The right schedule for you to have regular tests and exams.  Things you can do on your own to prevent diseases and keep yourself healthy. What should I know about diet, weight, and exercise? Eat a healthy diet   Eat a diet that includes plenty of vegetables, fruits, low-fat dairy products, and lean protein.  Do not eat a lot of foods that are high in solid fats, added sugars, or sodium. Maintain a healthy weight Body mass index (BMI) is used to identify weight problems. It estimates body fat based on height and weight. Your health care provider can help determine your BMI and help you achieve or maintain a healthy weight. Get regular exercise Get regular exercise. This is one of the most important things you can do for your health. Most adults should:  Exercise for at least 150 minutes each week. The exercise should increase your heart rate and make you sweat (moderate-intensity exercise).  Do strengthening exercises at least twice a week. This is in addition to the moderate-intensity exercise.  Spend less time sitting. Even light physical activity can be beneficial. Watch cholesterol and blood lipids Have your blood tested for lipids and cholesterol at 49 years of age, then have this test every 5 years. Have your cholesterol levels checked more often if:  Your lipid or cholesterol levels are high.  You are older than 49 years of age.  You are at high risk for heart disease. What should I know about cancer screening? Depending on your health history and family history, you may need to have cancer screening at various ages. This may include screening for:  Breast cancer.  Cervical cancer.  Colorectal cancer.  Skin cancer.  Lung cancer. What should I know about heart disease, diabetes, and high blood  pressure? Blood pressure and heart disease  High blood pressure causes heart disease and increases the risk of stroke. This is more likely to develop in people who have high blood pressure readings, are of African descent, or are overweight.  Have your blood pressure checked: ? Every 3-5 years if you are 18-39 years of age. ? Every year if you are 40 years old or older. Diabetes Have regular diabetes screenings. This checks your fasting blood sugar level. Have the screening done:  Once every three years after age 40 if you are at a normal weight and have a low risk for diabetes.  More often and at a younger age if you are overweight or have a high risk for diabetes. What should I know about preventing infection? Hepatitis B If you have a higher risk for hepatitis B, you should be screened for this virus. Talk with your health care provider to find out if you are at risk for hepatitis B infection. Hepatitis C Testing is recommended for:  Everyone born from 1945 through 1965.  Anyone with known risk factors for hepatitis C. Sexually transmitted infections (STIs)  Get screened for STIs, including gonorrhea and chlamydia, if: ? You are sexually active and are younger than 49 years of age. ? You are older than 49 years of age and your health care provider tells you that you are at risk for this type of infection. ? Your sexual activity has changed since you were last screened, and you are at increased risk for chlamydia or gonorrhea. Ask your health care provider if   you are at risk.  Ask your health care provider about whether you are at high risk for HIV. Your health care provider may recommend a prescription medicine to help prevent HIV infection. If you choose to take medicine to prevent HIV, you should first get tested for HIV. You should then be tested every 3 months for as long as you are taking the medicine. Pregnancy  If you are about to stop having your period (premenopausal) and  you may become pregnant, seek counseling before you get pregnant.  Take 400 to 800 micrograms (mcg) of folic acid every day if you become pregnant.  Ask for birth control (contraception) if you want to prevent pregnancy. Osteoporosis and menopause Osteoporosis is a disease in which the bones lose minerals and strength with aging. This can result in bone fractures. If you are 65 years old or older, or if you are at risk for osteoporosis and fractures, ask your health care provider if you should:  Be screened for bone loss.  Take a calcium or vitamin D supplement to lower your risk of fractures.  Be given hormone replacement therapy (HRT) to treat symptoms of menopause. Follow these instructions at home: Lifestyle  Do not use any products that contain nicotine or tobacco, such as cigarettes, e-cigarettes, and chewing tobacco. If you need help quitting, ask your health care provider.  Do not use street drugs.  Do not share needles.  Ask your health care provider for help if you need support or information about quitting drugs. Alcohol use  Do not drink alcohol if: ? Your health care provider tells you not to drink. ? You are pregnant, may be pregnant, or are planning to become pregnant.  If you drink alcohol: ? Limit how much you use to 0-1 drink a day. ? Limit intake if you are breastfeeding.  Be aware of how much alcohol is in your drink. In the [place] one drink equals one 12 oz bottle of beer (355 mL), one 5 oz glass of wine (148 mL), or one 1 oz glass of hard liquor (44 mL). General instructions  Schedule regular health, dental, and eye exams.  Stay current with your vaccines.  Tell your health care provider if: ? You often feel depressed. ? You have ever been abused or do not feel safe at home. Summary  Adopting a healthy lifestyle and getting preventive care are important in promoting health and wellness.  Follow your health care provider's instructions about healthy  diet, exercising, and getting tested or screened for diseases.  Follow your health care provider's instructions on monitoring your cholesterol and blood pressure. This information is not intended to replace advice given to you by your health care provider. Make sure you discuss any questions you have with your health care provider. Document Revised: 12/11/2017 Document Reviewed: 12/11/2017 Elsevier Patient Education  2020 Elsevier Inc.  

## 2019-05-26 LAB — CBC WITH DIFFERENTIAL/PLATELET
Basophils Absolute: 0.1 10*3/uL (ref 0.0–0.2)
Basos: 1 %
EOS (ABSOLUTE): 0.4 10*3/uL (ref 0.0–0.4)
Eos: 4 %
Hematocrit: 42.2 % (ref 34.0–46.6)
Hemoglobin: 13.5 g/dL (ref 11.1–15.9)
Immature Grans (Abs): 0 10*3/uL (ref 0.0–0.1)
Immature Granulocytes: 0 %
Lymphocytes Absolute: 2.8 10*3/uL (ref 0.7–3.1)
Lymphs: 26 %
MCH: 28.1 pg (ref 26.6–33.0)
MCHC: 32 g/dL (ref 31.5–35.7)
MCV: 88 fL (ref 79–97)
Monocytes Absolute: 0.4 10*3/uL (ref 0.1–0.9)
Monocytes: 4 %
Neutrophils Absolute: 6.8 10*3/uL (ref 1.4–7.0)
Neutrophils: 65 %
Platelets: 272 10*3/uL (ref 150–450)
RBC: 4.81 x10E6/uL (ref 3.77–5.28)
RDW: 13.1 % (ref 11.7–15.4)
WBC: 10.5 10*3/uL (ref 3.4–10.8)

## 2019-05-26 LAB — STD SCREEN (8)
HIV Screen 4th Generation wRfx: NONREACTIVE
HSV 1 Glycoprotein G Ab, IgG: <0.91 index (ref 0.00–0.90)
HSV 2 IgG, Type Spec: 12.8 index — ABNORMAL HIGH (ref 0.00–0.90)
Hep A IgM: NEGATIVE
Hep B C IgM: NEGATIVE
Hep C Virus Ab: <0.1 s/co ratio (ref 0.0–0.9)
Hepatitis B Surface Ag: NEGATIVE
RPR Ser Ql: NONREACTIVE

## 2019-05-26 LAB — CHLAMYDIA/GONOCOCCUS/TRICHOMONAS, NAA
Chlamydia by NAA: NEGATIVE
Gonococcus by NAA: NEGATIVE
Trich vag by NAA: NEGATIVE

## 2019-05-26 LAB — LIPID PANEL
Chol/HDL Ratio: 2.9 ratio (ref 0.0–4.4)
Cholesterol, Total: 120 mg/dL (ref 100–199)
HDL: 42 mg/dL (ref >39)
LDL Chol Calc (NIH): 65 mg/dL (ref 0–99)
Triglycerides: 61 mg/dL (ref 0–149)
VLDL Cholesterol Cal: 13 mg/dL (ref 5–40)

## 2019-05-26 LAB — CMP14+EGFR
ALT: 14 IU/L (ref 0–32)
AST: 14 IU/L (ref 0–40)
Albumin/Globulin Ratio: 2.4 — ABNORMAL HIGH (ref 1.2–2.2)
Albumin: 4.5 g/dL (ref 3.8–4.8)
Alkaline Phosphatase: 76 IU/L (ref 48–121)
BUN/Creatinine Ratio: 7 — ABNORMAL LOW (ref 9–23)
BUN: 7 mg/dL (ref 6–24)
Bilirubin Total: 0.3 mg/dL (ref 0.0–1.2)
CO2: 23 mmol/L (ref 20–29)
Calcium: 10 mg/dL (ref 8.7–10.2)
Chloride: 103 mmol/L (ref 96–106)
Creatinine, Ser: 1.06 mg/dL — ABNORMAL HIGH (ref 0.57–1.00)
GFR calc Af Amer: 72 mL/min/{1.73_m2} (ref >59)
GFR calc non Af Amer: 62 mL/min/{1.73_m2} (ref >59)
Globulin, Total: 1.9 g/dL (ref 1.5–4.5)
Glucose: 95 mg/dL (ref 65–99)
Potassium: 4.2 mmol/L (ref 3.5–5.2)
Sodium: 138 mmol/L (ref 134–144)
Total Protein: 6.4 g/dL (ref 6.0–8.5)

## 2019-05-26 LAB — TSH: TSH: 1.17 u[IU]/mL (ref 0.450–4.500)

## 2019-05-26 NOTE — Progress Notes (Signed)
Kidney and liver function stable CBC stable LDL stable Thyroid levels normal STD panel (Hepatitis A, B, & C, HIV, Syphilis, Herpes 1)- Negative, Herpes 2 is positive- Let me know if you develop any sore/lesions and we can send in medication for flare ups. Chlamydia and Gonorrhea negative  Pap pending

## 2019-05-27 ENCOUNTER — Other Ambulatory Visit: Payer: Self-pay

## 2019-05-27 ENCOUNTER — Ambulatory Visit (HOSPITAL_COMMUNITY)
Admission: RE | Admit: 2019-05-27 | Discharge: 2019-05-27 | Disposition: A | Discharge status: Home / Self Care | Payer: Medicare Other | Source: Ambulatory Visit | Attending: Family Medicine | Admitting: Family Medicine

## 2019-05-27 DIAGNOSIS — Z1231 Encounter for screening mammogram for malignant neoplasm of breast: Secondary | ICD-10-CM | POA: Diagnosis not present

## 2019-05-27 LAB — IGP, APTIMA HPV, RFX 16/18,45: HPV Aptima: NEGATIVE

## 2019-05-27 LAB — TOXASSURE SELECT 13 (MW), URINE

## 2019-05-28 ENCOUNTER — Other Ambulatory Visit: Payer: Self-pay | Admitting: Family

## 2019-05-28 DIAGNOSIS — F1290 Cannabis use, unspecified, uncomplicated: Secondary | ICD-10-CM

## 2019-05-28 HISTORY — DX: Cannabis use, unspecified, uncomplicated: F12.90

## 2019-05-28 MED ORDER — BUSPIRONE HCL 5 MG PO TABS: 5.0000 mg | ORAL_TABLET | Freq: Three times a day (TID) | ORAL | 1 refills | Status: DC | PRN

## 2019-06-08 ENCOUNTER — Other Ambulatory Visit: Payer: Self-pay | Admitting: *Deleted

## 2019-06-08 MED ORDER — ATORVASTATIN CALCIUM 40 MG PO TABS: 40.0000 mg | ORAL_TABLET | Freq: Every day | ORAL | 2 refills | Status: DC

## 2019-06-21 ENCOUNTER — Other Ambulatory Visit: Payer: Self-pay | Admitting: Family

## 2019-07-21 ENCOUNTER — Encounter: Payer: Self-pay | Admitting: Family

## 2019-07-21 ENCOUNTER — Telehealth (INDEPENDENT_AMBULATORY_CARE_PROVIDER_SITE_OTHER): Payer: Medicare Other | Admitting: Family

## 2019-07-21 DIAGNOSIS — F411 Generalized anxiety disorder: Secondary | ICD-10-CM | POA: Diagnosis not present

## 2019-07-21 NOTE — Progress Notes (Signed)
   Virtual Visit via telephone Note Due to COVID-19 pandemic this visit was conducted virtually. This visit type was conducted due to national recommendations for restrictions regarding the COVID-19 Pandemic (e.g. social distancing, sheltering in place) in an effort to limit this patient's exposure and mitigate transmission in our community. All issues noted in this document were discussed and addressed.  A physical exam was not performed with this format.  I connected with Sheena Willis on 07/21/19 at 10:30 AM  by telephone and verified that I am speaking with the correct person using two identifiers. Sheena Willis is currently located at daughters home and no one  is currently with her during visit. The provider, Evelina Dun, FNP is located in their office at time of visit.  I discussed the limitations, risks, security and privacy concerns of performing an evaluation and management service by telephone and the availability of in person appointments. I also discussed with the patient that there may be a patient responsible charge related to this service. The patient expressed understanding and agreed to proceed.   History and Present Illness:  Anxiety Presents for follow-up visit. Symptoms include depressed mood, excessive worry, irritability and nervous/anxious behavior. Symptoms occur occasionally. The severity of symptoms is mild.        Review of Systems  Constitutional: Positive for irritability.  Psychiatric/Behavioral: The patient is nervous/anxious.      Observations/Objective: No SOB or distress noted  Assessment and Plan: 1. GAD (generalized anxiety disorder) Discussed Buspar and how to take We also discussed her drug screen and we are no longer allowed to prescribe xanax, she can take the Buspar as needed.  Stress management  RTO as needed and keep chronic follow up     I discussed the assessment and treatment plan with the patient. The patient was provided an  opportunity to ask questions and all were answered. The patient agreed with the plan and demonstrated an understanding of the instructions.   The patient was advised to call back or seek an in-person evaluation if the symptoms worsen or if the condition fails to improve as anticipated.  The above assessment and management plan was discussed with the patient. The patient verbalized understanding of and has agreed to the management plan. Patient is aware to call the clinic if symptoms persist or worsen. Patient is aware when to return to the clinic for a follow-up visit. Patient educated on when it is appropriate to go to the emergency department.   Time call ended:  10:41 AM   I provided 11 minutes of non-face-to-face time during this encounter.    Evelina Dun, FNP

## 2019-08-12 ENCOUNTER — Other Ambulatory Visit: Payer: Self-pay

## 2019-08-12 NOTE — Progress Notes (Signed)
Dg w

## 2019-08-28 ENCOUNTER — Other Ambulatory Visit: Payer: Self-pay

## 2019-08-28 DIAGNOSIS — N939 Abnormal uterine and vaginal bleeding, unspecified: Secondary | ICD-10-CM

## 2019-08-28 MED ORDER — MEDROXYPROGESTERONE ACETATE 150 MG/ML IM SUSY: PREFILLED_SYRINGE | INTRAMUSCULAR | 2 refills | Status: DC

## 2019-09-15 ENCOUNTER — Other Ambulatory Visit: Payer: Self-pay | Admitting: Family

## 2019-12-09 ENCOUNTER — Other Ambulatory Visit: Payer: Self-pay | Admitting: Family

## 2020-01-03 ENCOUNTER — Other Ambulatory Visit: Payer: Self-pay | Admitting: Family

## 2020-01-06 ENCOUNTER — Other Ambulatory Visit: Payer: Self-pay | Admitting: Family

## 2020-03-10 ENCOUNTER — Other Ambulatory Visit: Payer: Self-pay | Admitting: Family

## 2020-04-01 ENCOUNTER — Other Ambulatory Visit: Payer: Self-pay | Admitting: Family

## 2020-04-01 NOTE — Telephone Encounter (Signed)
[facility]. NTBS 30 days given 03/10/20

## 2020-04-21 ENCOUNTER — Other Ambulatory Visit: Payer: Self-pay | Admitting: Family

## 2020-04-21 NOTE — Telephone Encounter (Signed)
[facility]. NTBS 30 days given 03/10/20

## 2020-05-05 DIAGNOSIS — D329 Benign neoplasm of meninges, unspecified: Secondary | ICD-10-CM | POA: Diagnosis not present

## 2020-05-05 DIAGNOSIS — D320 Benign neoplasm of cerebral meninges: Secondary | ICD-10-CM | POA: Diagnosis not present

## 2020-05-07 ENCOUNTER — Other Ambulatory Visit: Payer: Self-pay | Admitting: Family

## 2020-05-09 NOTE — Telephone Encounter (Signed)
[facility]. NTBS 30 days given 03/10/20

## 2020-05-22 ENCOUNTER — Other Ambulatory Visit: Payer: Self-pay | Admitting: Family

## 2020-05-22 DIAGNOSIS — N939 Abnormal uterine and vaginal bleeding, unspecified: Secondary | ICD-10-CM

## 2020-06-02 ENCOUNTER — Other Ambulatory Visit: Payer: Self-pay | Admitting: Family

## 2020-06-02 NOTE — Telephone Encounter (Signed)
LM for pt to call for appt -jhb

## 2020-06-02 NOTE — Telephone Encounter (Signed)
[facility]. NTBS 30 days given 03/10/20

## 2020-07-08 ENCOUNTER — Other Ambulatory Visit: Payer: Self-pay | Admitting: Family

## 2020-07-08 DIAGNOSIS — N939 Abnormal uterine and vaginal bleeding, unspecified: Secondary | ICD-10-CM

## 2020-07-08 DIAGNOSIS — F411 Generalized anxiety disorder: Secondary | ICD-10-CM

## 2020-07-08 DIAGNOSIS — F419 Anxiety disorder, unspecified: Secondary | ICD-10-CM

## 2020-07-08 NOTE — Telephone Encounter (Signed)
Patient last seen 07/21/2019. Patient needs an appointment to be seen.

## 2020-07-18 ENCOUNTER — Other Ambulatory Visit: Payer: Self-pay | Admitting: Family

## 2020-07-18 DIAGNOSIS — D329 Benign neoplasm of meninges, unspecified: Secondary | ICD-10-CM | POA: Diagnosis not present

## 2020-07-18 DIAGNOSIS — Z510 Encounter for antineoplastic radiation therapy: Secondary | ICD-10-CM | POA: Diagnosis not present

## 2020-07-18 DIAGNOSIS — D320 Benign neoplasm of cerebral meninges: Secondary | ICD-10-CM | POA: Diagnosis not present

## 2020-07-22 DIAGNOSIS — Z510 Encounter for antineoplastic radiation therapy: Secondary | ICD-10-CM | POA: Diagnosis not present

## 2020-07-22 DIAGNOSIS — D329 Benign neoplasm of meninges, unspecified: Secondary | ICD-10-CM | POA: Diagnosis not present

## 2020-07-27 ENCOUNTER — Other Ambulatory Visit: Payer: Self-pay | Admitting: Family

## 2020-07-27 MED ORDER — ATORVASTATIN CALCIUM 40 MG PO TABS: 40.0000 mg | ORAL_TABLET | Freq: Every day | ORAL | 0 refills | Status: DC

## 2020-07-27 NOTE — Telephone Encounter (Signed)
  Prescription Request  07/27/2020  What is the name of the medication or equipment? atorvastatin (LIPITOR) 40 MG tablet  Have you contacted your pharmacy to request a refill? (if applicable) Yes  Which pharmacy would you like this sent to? [facility] Pt has appt on 08/12/2020 she has been out of this medication for a couple of weeks    Patient notified that their request is being sent to the clinical staff for review and that they should receive a response within 2 business days.

## 2020-07-27 NOTE — Telephone Encounter (Signed)
Last office visit 07/2019

## 2020-08-12 ENCOUNTER — Ambulatory Visit: Payer: Medicare Other | Admitting: Family

## 2020-08-12 ENCOUNTER — Encounter: Payer: Self-pay | Admitting: Family

## 2020-08-12 ENCOUNTER — Ambulatory Visit (INDEPENDENT_AMBULATORY_CARE_PROVIDER_SITE_OTHER): Payer: Medicare Other | Admitting: Family

## 2020-08-12 VITALS — BP 103/69 | HR 60 | Temp 98.5°F | Ht 66.0 in | Wt 138.6 lb

## 2020-08-12 DIAGNOSIS — Z0001 Encounter for general adult medical examination with abnormal findings: Secondary | ICD-10-CM

## 2020-08-12 DIAGNOSIS — Z1211 Encounter for screening for malignant neoplasm of colon: Secondary | ICD-10-CM | POA: Diagnosis not present

## 2020-08-12 DIAGNOSIS — Z23 Encounter for immunization: Secondary | ICD-10-CM | POA: Diagnosis not present

## 2020-08-12 DIAGNOSIS — F411 Generalized anxiety disorder: Secondary | ICD-10-CM | POA: Diagnosis not present

## 2020-08-12 DIAGNOSIS — I63512 Cerebral infarction due to unspecified occlusion or stenosis of left middle cerebral artery: Secondary | ICD-10-CM | POA: Diagnosis not present

## 2020-08-12 DIAGNOSIS — N939 Abnormal uterine and vaginal bleeding, unspecified: Secondary | ICD-10-CM

## 2020-08-12 DIAGNOSIS — D329 Benign neoplasm of meninges, unspecified: Secondary | ICD-10-CM

## 2020-08-12 DIAGNOSIS — I6930 Unspecified sequelae of cerebral infarction: Secondary | ICD-10-CM | POA: Diagnosis not present

## 2020-08-12 DIAGNOSIS — Z0000 Encounter for general adult medical examination without abnormal findings: Secondary | ICD-10-CM | POA: Diagnosis not present

## 2020-08-12 DIAGNOSIS — F419 Anxiety disorder, unspecified: Secondary | ICD-10-CM | POA: Diagnosis not present

## 2020-08-12 DIAGNOSIS — I6990 Unspecified sequelae of unspecified cerebrovascular disease: Secondary | ICD-10-CM | POA: Diagnosis not present

## 2020-08-12 MED ORDER — MEDROXYPROGESTERONE ACETATE 150 MG/ML IM SUSY: PREFILLED_SYRINGE | INTRAMUSCULAR | 6 refills | Status: DC

## 2020-08-12 MED ORDER — ATORVASTATIN CALCIUM 40 MG PO TABS: 40.0000 mg | ORAL_TABLET | Freq: Every day | ORAL | 3 refills | Status: DC

## 2020-08-12 MED ORDER — CITALOPRAM HYDROBROMIDE 40 MG PO TABS: 40.0000 mg | ORAL_TABLET | Freq: Every day | ORAL | 3 refills | Status: DC

## 2020-08-12 MED ORDER — BUSPIRONE HCL 5 MG PO TABS: 5.0000 mg | ORAL_TABLET | Freq: Three times a day (TID) | ORAL | 3 refills | Status: DC | PRN

## 2020-08-12 NOTE — Progress Notes (Signed)
Subjective:    Patient ID: Sheena Willis, female    DOB: 06-Oct-1970, 50 y.o.   MRN: [id number]   Chief Complaint  Patient presents with   Annual Exam    PATIENT DOES NOT WANT A PAP    Pt presents to the office today for CPE without pap. She is followed by Neurologists every 6 months for Hx CVA with right sided weakness and hx meningioma. She is followed by Cardiologists  for PFO.  Anxiety Presents for follow-up visit. Symptoms include depressed mood, excessive worry, irritability, nervous/anxious behavior and restlessness. Symptoms occur most days.    Hyperlipidemia This is a chronic problem. The current episode started more than 1 year ago. Current antihyperlipidemic treatment includes statins. The current treatment provides moderate improvement of lipids. Risk factors for coronary artery disease include dyslipidemia.     Review of Systems  Constitutional:  Positive for irritability.  Psychiatric/Behavioral:  The patient is nervous/anxious.   All other systems reviewed and are negative.  Family History  Problem Relation Age of Onset   Cancer Maternal Aunt    Diabetes Maternal Aunt    Cancer Maternal Aunt    Social History   Socioeconomic History   Marital status: Divorced    Spouse name: Not on file   Number of children: 2   Years of education: 12   Highest education level: High school graduate  Occupational History   Occupation: disability  Tobacco Use   Smoking status: Former    Packs/day: 0.25    Types: Cigarettes    Quit date: 01/24/2018    Years since quitting: 2.5   Smokeless tobacco: Never  Vaping Use   Vaping Use: Never used  Substance and Sexual Activity   Alcohol use: No   Drug use: No   Sexual activity: Not Currently  Other Topics Concern   Not on file  Social History Narrative   Not on file   Social Determinants of Health   Financial Resource Strain: Not on file  Food Insecurity: Not on file  Transportation Needs: Not on file  Physical  Activity: Not on file  Stress: Not on file  Social Connections: Not on file       Objective:   Physical Exam Vitals reviewed.  Constitutional:      General: She is not in acute distress.    Appearance: She is well-developed.  HENT:     Head: Normocephalic and atraumatic.     Right Ear: Tympanic membrane normal.     Left Ear: Tympanic membrane normal.  Eyes:     Pupils: Pupils are equal, round, and reactive to light.  Neck:     Thyroid: No thyromegaly.  Cardiovascular:     Rate and Rhythm: Normal rate and regular rhythm.     Heart sounds: Normal heart sounds. No murmur heard. Pulmonary:     Effort: Pulmonary effort is normal. No respiratory distress.     Breath sounds: Normal breath sounds. No wheezing.  Abdominal:     General: Bowel sounds are normal. There is no distension.     Palpations: Abdomen is soft.     Tenderness: There is no abdominal tenderness.  Musculoskeletal:        General: No tenderness. Normal range of motion.     Cervical back: Normal range of motion and neck supple.  Skin:    General: Skin is warm and dry.  Neurological:     Mental Status: She is alert and oriented to person, place, and  time.     Cranial Nerves: No cranial nerve deficit.     Deep Tendon Reflexes: Reflexes are normal and symmetric.  Psychiatric:        Behavior: Behavior normal.        Thought Content: Thought content normal.        Judgment: Judgment normal.      BP 103/69   Pulse 60   Temp 98.5 F (36.9 C) (Temporal)   Ht $R'5\' 6"'Qw$  (1.676 m)   Wt 138 lb 9.6 oz (62.9 kg)   SpO2 97%   BMI 22.37 kg/m      Assessment & Plan:  CAOIMHE DAMRON comes in today with chief complaint of Annual Exam (PATIENT DOES NOT WANT A PAP )   Diagnosis and orders addressed:  1. GAD (generalized anxiety disorder) - citalopram (CELEXA) 40 MG tablet; Take 1 tablet (40 mg total) by mouth daily.  Dispense: 90 tablet; Refill: 3 - CMP14+EGFR - CBC with Differential/Platelet  2. Anxiety -  citalopram (CELEXA) 40 MG tablet; Take 1 tablet (40 mg total) by mouth daily.  Dispense: 90 tablet; Refill: 3 - CMP14+EGFR - CBC with Differential/Platelet  3. Cerebrovascular accident (CVA) due to stenosis of left middle cerebral artery (HCC) - CMP14+EGFR - CBC with Differential/Platelet  4. Menstrual bleeding problem - medroxyPROGESTERone Acetate 150 MG/ML SUSY; INJECT 1 ML (150 MG TOTAL) INTO THE MUSCLE EVERY 3 (THREE) MONTHS.  Dispense: 1 mL; Refill: 6 - CMP14+EGFR - CBC with Differential/Platelet  5. Annual physical exam - CMP14+EGFR - CBC with Differential/Platelet - Lipid panel - TSH  6. Meningioma (HCC) - CMP14+EGFR - CBC with Differential/Platelet  7. Late effects of CVA (cerebrovascular accident) - CMP14+EGFR - CBC with Differential/Platelet  8. Colon cancer screening - Cologuard - CMP14+EGFR - CBC with Differential/Platelet  9. Need for shingles vaccine - Varicella-zoster vaccine IM (Shingrix) - CMP14+EGFR - CBC with Differential/Platelet   Labs pending Health Maintenance reviewed Diet and exercise encouraged  Follow up plan: 1 year   Evelina Dun, FNP

## 2020-08-12 NOTE — Patient Instructions (Signed)
Health Maintenance, Female Adopting a healthy lifestyle and getting preventive care are important in promoting health and wellness. Ask your health care provider about: The right schedule for you to have regular tests and exams. Things you can do on your own to prevent diseases and keep yourself healthy. What should I know about diet, weight, and exercise? Eat a healthy diet  Eat a diet that includes plenty of vegetables, fruits, low-fat dairy products, and lean protein. Do not eat a lot of foods that are high in solid fats, added sugars, or sodium.  Maintain a healthy weight Body mass index (BMI) is used to identify weight problems. It estimates body fat based on height and weight. Your health care provider can help determineyour BMI and help you achieve or maintain a healthy weight. Get regular exercise Get regular exercise. This is one of the most important things you can do for your health. Most adults should: Exercise for at least 150 minutes each week. The exercise should increase your heart rate and make you sweat (moderate-intensity exercise). Do strengthening exercises at least twice a week. This is in addition to the moderate-intensity exercise. Spend less time sitting. Even light physical activity can be beneficial. Watch cholesterol and blood lipids Have your blood tested for lipids and cholesterol at 50 years of age, then havethis test every 5 years. Have your cholesterol levels checked more often if: Your lipid or cholesterol levels are high. You are older than 50 years of age. You are at high risk for heart disease. What should I know about cancer screening? Depending on your health history and family history, you may need to have cancer screening at various ages. This may include screening for: Breast cancer. Cervical cancer. Colorectal cancer. Skin cancer. Lung cancer. What should I know about heart disease, diabetes, and high blood pressure? Blood pressure and heart  disease High blood pressure causes heart disease and increases the risk of stroke. This is more likely to develop in people who have high blood pressure readings, are of African descent, or are overweight. Have your blood pressure checked: Every 3-5 years if you are 18-39 years of age. Every year if you are 40 years old or older. Diabetes Have regular diabetes screenings. This checks your fasting blood sugar level. Have the screening done: Once every three years after age 40 if you are at a normal weight and have a low risk for diabetes. More often and at a younger age if you are overweight or have a high risk for diabetes. What should I know about preventing infection? Hepatitis B If you have a higher risk for hepatitis B, you should be screened for this virus. Talk with your health care provider to find out if you are at risk forhepatitis B infection. Hepatitis C Testing is recommended for: Everyone born from 1945 through 1965. Anyone with known risk factors for hepatitis C. Sexually transmitted infections (STIs) Get screened for STIs, including gonorrhea and chlamydia, if: You are sexually active and are younger than 50 years of age. You are older than 50 years of age and your health care provider tells you that you are at risk for this type of infection. Your sexual activity has changed since you were last screened, and you are at increased risk for chlamydia or gonorrhea. Ask your health care provider if you are at risk. Ask your health care provider about whether you are at high risk for HIV. Your health care provider may recommend a prescription medicine to help   prevent HIV infection. If you choose to take medicine to prevent HIV, you should first get tested for HIV. You should then be tested every 3 months for as long as you are taking the medicine. Pregnancy If you are about to stop having your period (premenopausal) and you may become pregnant, seek counseling before you get  pregnant. Take 400 to 800 micrograms (mcg) of folic acid every day if you become pregnant. Ask for birth control (contraception) if you want to prevent pregnancy. Osteoporosis and menopause Osteoporosis is a disease in which the bones lose minerals and strength with aging. This can result in bone fractures. If you are 65 years old or older, or if you are at risk for osteoporosis and fractures, ask your health care provider if you should: Be screened for bone loss. Take a calcium or vitamin D supplement to lower your risk of fractures. Be given hormone replacement therapy (HRT) to treat symptoms of menopause. Follow these instructions at home: Lifestyle Do not use any products that contain nicotine or tobacco, such as cigarettes, e-cigarettes, and chewing tobacco. If you need help quitting, ask your health care provider. Do not use street drugs. Do not share needles. Ask your health care provider for help if you need support or information about quitting drugs. Alcohol use Do not drink alcohol if: Your health care provider tells you not to drink. You are pregnant, may be pregnant, or are planning to become pregnant. If you drink alcohol: Limit how much you use to 0-1 drink a day. Limit intake if you are breastfeeding. Be aware of how much alcohol is in your drink. In the [place] one drink equals one 12 oz bottle of beer (355 mL), one 5 oz glass of wine (148 mL), or one 1 oz glass of hard liquor (44 mL). General instructions Schedule regular health, dental, and eye exams. Stay current with your vaccines. Tell your health care provider if: You often feel depressed. You have ever been abused or do not feel safe at home. Summary Adopting a healthy lifestyle and getting preventive care are important in promoting health and wellness. Follow your health care provider's instructions about healthy diet, exercising, and getting tested or screened for diseases. Follow your health care provider's  instructions on monitoring your cholesterol and blood pressure. This information is not intended to replace advice given to you by your health care provider. Make sure you discuss any questions you have with your healthcare provider. Document Revised: 12/11/2017 Document Reviewed: 12/11/2017 Elsevier Patient Education  2022 Elsevier Inc.  

## 2020-08-13 LAB — CBC WITH DIFFERENTIAL/PLATELET
Basophils Absolute: 0.1 10*3/uL (ref 0.0–0.2)
Basos: 1 %
EOS (ABSOLUTE): 0.2 10*3/uL (ref 0.0–0.4)
Eos: 2 %
Hematocrit: 44.1 % (ref 34.0–46.6)
Hemoglobin: 14.4 g/dL (ref 11.1–15.9)
Immature Grans (Abs): 0 10*3/uL (ref 0.0–0.1)
Immature Granulocytes: 0 %
Lymphocytes Absolute: 3.4 10*3/uL — ABNORMAL HIGH (ref 0.7–3.1)
Lymphs: 39 %
MCH: 28.8 pg (ref 26.6–33.0)
MCHC: 32.7 g/dL (ref 31.5–35.7)
MCV: 88 fL (ref 79–97)
Monocytes Absolute: 0.4 10*3/uL (ref 0.1–0.9)
Monocytes: 4 %
Neutrophils Absolute: 4.6 10*3/uL (ref 1.4–7.0)
Neutrophils: 54 %
Platelets: 263 10*3/uL (ref 150–450)
RBC: 5 x10E6/uL (ref 3.77–5.28)
RDW: 13.2 % (ref 11.7–15.4)
WBC: 8.8 10*3/uL (ref 3.4–10.8)

## 2020-08-13 LAB — CMP14+EGFR
ALT: 10 IU/L (ref 0–32)
AST: 11 IU/L (ref 0–40)
Albumin/Globulin Ratio: 2.4 — ABNORMAL HIGH (ref 1.2–2.2)
Albumin: 4.5 g/dL (ref 3.8–4.8)
Alkaline Phosphatase: 67 IU/L (ref 44–121)
BUN/Creatinine Ratio: 12 (ref 9–23)
BUN: 11 mg/dL (ref 6–24)
Bilirubin Total: 0.3 mg/dL (ref 0.0–1.2)
CO2: 22 mmol/L (ref 20–29)
Calcium: 9.7 mg/dL (ref 8.7–10.2)
Chloride: 105 mmol/L (ref 96–106)
Creatinine, Ser: 0.95 mg/dL (ref 0.57–1.00)
Globulin, Total: 1.9 g/dL (ref 1.5–4.5)
Glucose: 90 mg/dL (ref 65–99)
Potassium: 4.6 mmol/L (ref 3.5–5.2)
Sodium: 139 mmol/L (ref 134–144)
Total Protein: 6.4 g/dL (ref 6.0–8.5)
eGFR: 73 mL/min/{1.73_m2} (ref >59)

## 2020-08-13 LAB — LIPID PANEL
Chol/HDL Ratio: 3.6 ratio (ref 0.0–4.4)
Cholesterol, Total: 139 mg/dL (ref 100–199)
HDL: 39 mg/dL — ABNORMAL LOW (ref >39)
LDL Chol Calc (NIH): 87 mg/dL (ref 0–99)
Triglycerides: 65 mg/dL (ref 0–149)
VLDL Cholesterol Cal: 13 mg/dL (ref 5–40)

## 2020-08-13 LAB — TSH: TSH: 0.851 u[IU]/mL (ref 0.450–4.500)

## 2020-09-02 DIAGNOSIS — Z1211 Encounter for screening for malignant neoplasm of colon: Secondary | ICD-10-CM | POA: Diagnosis not present

## 2020-09-04 ENCOUNTER — Other Ambulatory Visit: Payer: Self-pay | Admitting: Family

## 2020-09-08 LAB — COLOGUARD: Cologuard: NEGATIVE

## 2020-09-19 ENCOUNTER — Other Ambulatory Visit (HOSPITAL_COMMUNITY): Payer: Self-pay | Admitting: Family

## 2020-09-19 DIAGNOSIS — Z1231 Encounter for screening mammogram for malignant neoplasm of breast: Secondary | ICD-10-CM

## 2020-09-28 ENCOUNTER — Ambulatory Visit (HOSPITAL_COMMUNITY): Payer: Medicare Other

## 2020-09-29 ENCOUNTER — Other Ambulatory Visit: Payer: Self-pay

## 2020-09-29 ENCOUNTER — Ambulatory Visit (HOSPITAL_COMMUNITY)
Admission: RE | Admit: 2020-09-29 | Discharge: 2020-09-29 | Disposition: A | Discharge status: Home / Self Care | Payer: Medicare Other | Source: Ambulatory Visit | Attending: Family | Admitting: Family

## 2020-09-29 DIAGNOSIS — Z1231 Encounter for screening mammogram for malignant neoplasm of breast: Secondary | ICD-10-CM | POA: Insufficient documentation

## 2020-10-04 ENCOUNTER — Ambulatory Visit (INDEPENDENT_AMBULATORY_CARE_PROVIDER_SITE_OTHER): Payer: Medicare Other

## 2020-10-04 VITALS — Ht 66.0 in | Wt 139.0 lb

## 2020-10-04 DIAGNOSIS — Z0000 Encounter for general adult medical examination without abnormal findings: Secondary | ICD-10-CM

## 2020-10-04 NOTE — Progress Notes (Signed)
Subjective:   Sheena Willis is a 50 y.o. female who presents for Medicare Annual (Subsequent) preventive examination.  Virtual Visit via Telephone Note  I connected with  Sheena Willis on 10/04/20 at  3:30 PM EDT by telephone and verified that I am speaking with the correct person using two identifiers.  Location: Patient: Home Provider: [facility] Persons participating in the virtual visit: patient/Nurse Health Advisor   I discussed the limitations, risks, security and privacy concerns of performing an evaluation and management service by telephone and the availability of in person appointments. The patient expressed understanding and agreed to proceed.  Interactive audio and video telecommunications were attempted between this nurse and patient, however failed, due to patient having technical difficulties OR patient did not have access to video capability.  We continued and completed visit with audio only.  Some vital signs may be absent or patient reported.   [name] E [name], LPN   Review of Systems     Cardiac Risk Factors include: smoking/ tobacco exposure;Other (see comment), Risk factor comments: hx of CVA, hx of brain tumors, cannibus use     Objective:    Today's Vitals   10/04/20 1451  Weight: 139 lb (63 kg)  Height: _0  (1.676 m)   Body mass index is 22.44 kg/m.  Advanced Directives 10/04/2020 09/15/2018 01/09/2018 07/16/2012  Does Patient Have a Medical Advance Directive? No No No Patient does not have advance directive;Patient would not like information  Would patient like information on creating a medical advance directive? No - Patient declined No - Patient declined No - Patient declined -  Pre-existing out of facility DNR order (yellow form or pink MOST form) - - - No    Current Medications (verified) Outpatient Encounter Medications as of 10/04/2020  Medication Sig   aspirin EC 81 MG tablet Take by mouth.   atorvastatin (LIPITOR) 40 MG tablet Take 1 tablet  (40 mg total) by mouth daily.   busPIRone (BUSPAR) 5 MG tablet TAKE 1 TABLET BY MOUTH 3 TIMES DAILY AS NEEDED.   cholecalciferol (VITAMIN D3) 25 MCG (1000 UNIT) tablet Take 1,000 Units by mouth daily.   citalopram (CELEXA) 40 MG tablet Take 1 tablet (40 mg total) by mouth daily.   medroxyPROGESTERone Acetate 150 MG/ML SUSY INJECT 1 ML (150 MG TOTAL) INTO THE MUSCLE EVERY 3 (THREE) MONTHS.   Multiple Vitamins-Minerals (MULTIVITAMINS THER. W/MINERALS) TABS Take 1 tablet by mouth daily.   vitamin C (ASCORBIC ACID) 500 MG tablet Take 500 mg by mouth daily.   clopidogrel (PLAVIX) 75 MG tablet Take 1 tablet by mouth daily. (Patient not taking: Reported on 10/04/2020)   No facility-administered encounter medications on file as of 10/04/2020.    Allergies (verified) Patient has no known allergies.   History: Past Medical History:  Diagnosis Date   Brain tumor (benign) ([place])    Radiation    for brain tumors   Vertigo    due to benign brain tumor   Past Surgical History:  Procedure Laterality Date   BRAIN BIOPSY  75/08/8323   HALO APPLICATION     done before gamma knife radio surgery   LESION REMOVAL Left 07/23/2012   Procedure: EXCISION SKIN NEOPLASM LEFT ELBOW;  Surgeon: Jamesetta So, MD;  Location: [facility];  Service: General;  Laterality: Left;   MYRINGOTOMY WITH TUBE PLACEMENT Left    Dr. Benjamine Mola   Family History  Problem Relation Age of Onset   Cancer Maternal Aunt    Diabetes  Maternal Aunt    Cancer Maternal Aunt    Social History   Socioeconomic History   Marital status: Divorced    Spouse name: Not on file   Number of children: 2   Years of education: 12   Highest education level: High school graduate  Occupational History   Occupation: disability  Tobacco Use   Smoking status: Former    Packs/day: 0.25    Types: Cigarettes    Quit date: 01/24/2018    Years since quitting: 2.6   Smokeless tobacco: Never  Vaping Use   Vaping Use: Never used  Substance and Sexual  Activity   Alcohol use: No   Drug use: No   Sexual activity: Not Currently  Other Topics Concern   Not on file  Social History Narrative   Not on file   Social Determinants of Health   Financial Resource Strain: Low Risk    Difficulty of Paying Living Expenses: Not hard at all  Food Insecurity: No Food Insecurity   Worried About Charity fundraiser in the Last Year: Never true   [place] in the Last Year: Never true  Transportation Needs: No Transportation Needs   Lack of Transportation (Medical): No   Lack of Transportation (Non-Medical): No  Physical Activity: Sufficiently Active   Days of Exercise per Week: 7 days   Minutes of Exercise per Session: 30 min  Stress: No Stress Concern Present   Feeling of Stress : Only a little  Social Connections: Moderately Integrated   Frequency of Communication with Friends and Family: More than three times a week   Frequency of Social Gatherings with Friends and Family: More than three times a week   Attends Religious Services: More than 4 times per year   Active Member of [employer] or Organizations: Yes   Attends Music therapist: More than 4 times per year   Marital Status: Divorced    Tobacco Counseling Counseling given: Not Answered   Clinical Intake:  Pre-visit preparation completed: Yes  Pain : No/denies pain     BMI - recorded: 22.44 Nutritional Status: BMI of 19-24  Normal Nutritional Risks: None  How often do you need to have someone help you when you read instructions, pamphlets, or other written materials from your doctor or pharmacy?: 1 - Never  Diabetic? no  Interpreter Needed?: No  Information entered by :: Crisanto Nied, LPN   Activities of Daily Living In your present state of health, do you have any difficulty performing the following activities: 10/04/2020  Hearing? N  Vision? N  Difficulty concentrating or making decisions? Y  Walking or climbing stairs? N  Dressing or bathing? N   Doing errands, shopping? N  Preparing Food and eating ? N  Using the Toilet? N  In the past six months, have you accidently leaked urine? N  Do you have problems with loss of bowel control? N  Managing your Medications? N  Managing your Finances? N  Housekeeping or managing your Housekeeping? N  Some recent data might be hidden    Patient Care Team: Sharion Balloon, FNP as PCP - General (Family Medicine) Leta Baptist, MD as Consulting Physician (Otolaryngology) Georgina Quint, MD as Referring Physician (Radiology) Edwina Barth, MD as Referring Physician (Internal Medicine)  Indicate any recent Medical Services you may have received from other than [facility] providers in the past year (date may be approximate).     Assessment:   This is a routine  wellness examination for [employer]  Hearing/Vision screen Hearing Screening - Comments:: Denies hearing difficulties  Vision Screening - Comments:: Wears rx glasses - up to date with annual eye exams with [facility]  Dietary issues and exercise activities discussed: Current Exercise Habits: Home exercise routine, Type of exercise: walking, Time (Minutes): 30, Frequency (Times/Week): 7, Weekly Exercise (Minutes/Week): 210, Intensity: Mild, Exercise limited by: neurologic condition(s)   Goals Addressed             This Visit's Progress    DIET - INCREASE WATER INTAKE   On track    Try to drink 6-8 glasses of water daily.       Depression Screen PHQ 2/9 Scores 10/04/2020 08/12/2020 05/25/2019 09/15/2018 01/24/2018 01/08/2018 05/16/2017  PHQ - 2 Score 0 0 0 0 0 0 0  PHQ- 9 Score 0 0 - - - - -    Fall Risk Fall Risk  10/04/2020 05/25/2019 09/15/2018 01/08/2018 05/16/2017  Falls in the past year? 0 0 0 0 No  Number falls in past yr: 0 - 0 - -  Injury with Fall? 0 - 0 - -  Risk for fall due to : Medication side effect - - - -  Follow up Falls prevention discussed - Falls prevention discussed - -  Comment - - get rid of all throw rugs in the  house, adequate lighting in walkways and grab bars in the bathroom - -    FALL RISK PREVENTION PERTAINING TO THE HOME:  Any stairs in or around the home? No  If so, are there any without handrails? No  Home free of loose throw rugs in walkways, pet beds, electrical cords, etc? Yes  Adequate lighting in your home to reduce risk of falls? Yes   ASSISTIVE DEVICES UTILIZED TO PREVENT FALLS:  Life alert? No  Use of a cane, walker or w/c? No  Grab bars in the bathroom? Yes  Shower chair or bench in shower? No  Elevated toilet seat or a handicapped toilet? No   TIMED UP AND GO:  Was the test performed? No . Telephonic visit  Cognitive Function:     6CIT Screen 10/04/2020 09/15/2018  What Year? 0 points 0 points  What month? 0 points 0 points  What time? 0 points 0 points  Count back from 20 0 points 0 points  Months in reverse 0 points 0 points  Repeat phrase 2 points 0 points  Total Score 2 0    Immunizations Immunization History  Administered Date(s) Administered   Janssen (J&J) SARS-COV-2 Vaccination 04/13/2019   MMR 06/18/1996   Moderna Sars-Covid-2 Vaccination 04/13/2019, 12/08/2019, 08/30/2020   Td 03/21/1989, 04/25/1994, 01/31/2005   Tdap 09/16/2018   Zoster Recombinat (Shingrix) 08/12/2020    TDAP status: Up to date  Flu Vaccine status: Declined, Education has been provided regarding the importance of this vaccine but patient still declined. Advised may receive this vaccine at local pharmacy or Health Dept. Aware to provide a copy of the vaccination record if obtained from local pharmacy or Health Dept. Verbalized acceptance and understanding.  Pneumococcal vaccine status: Due, Education has been provided regarding the importance of this vaccine. Advised may receive this vaccine at local pharmacy or Health Dept. Aware to provide a copy of the vaccination record if obtained from local pharmacy or Health Dept. Verbalized acceptance and understanding.  Covid-19 vaccine  status: Completed vaccines  Qualifies for Shingles Vaccine? Yes   Zostavax completed Yes   Shingrix Completed?: No.    Education  has been provided regarding the importance of this vaccine. Patient has been advised to call insurance company to determine out of pocket expense if they have not yet received this vaccine. Advised may also receive vaccine at local pharmacy or Health Dept. Verbalized acceptance and understanding.  Screening Tests Health Maintenance  Topic Date Due   URINE MICROALBUMIN  Never done   COLONOSCOPY (Pts 45-23yr Insurance coverage will need to be confirmed)  Never done   Zoster Vaccines- Shingrix (2 of 2) 10/07/2020   COVID-19 Vaccine (4 - Booster for Janssen series) 12/30/2020   MAMMOGRAM  05/26/2021   PAP SMEAR-Modifier  05/25/2022   TETANUS/TDAP  09/15/2028   Hepatitis C Screening  Completed   HIV Screening  Completed   HPV VACCINES  Aged Out   INFLUENZA VACCINE  Discontinued    Health Maintenance  Health Maintenance Due  Topic Date Due   URINE MICROALBUMIN  Never done   COLONOSCOPY (Pts 45-452yrInsurance coverage will need to be confirmed)  Never done    Colorectal cancer screening: Type of screening: Cologuard. Completed 09/02/2020. Repeat every 3 years  Mammogram status: Completed 09/30/2020. Repeat every year  Lung Cancer Screening: (Low Dose CT Chest recommended if Age 50-80ears, 30 pack-year currently smoking OR have quit w/in 15years.) does not qualify.   Additional Screening:  Hepatitis C Screening: does not qualify; Completed 05/25/2019  Vision Screening: Recommended annual ophthalmology exams for early detection of glaucoma and other disorders of the eye. Is the patient up to date with their annual eye exam?  Yes  Who is the provider or what is the name of the office in which the patient attends annual eye exams? [facility] If pt is not established with a provider, would they like to be referred to a provider to establish care? No .    Dental Screening: Recommended annual dental exams for proper oral hygiene  Community Resource Referral / Chronic Care Management: CRR required this visit?  No   CCM required this visit?  No      Plan:     I have personally reviewed and noted the following in the patient's chart:   Medical and social history Use of alcohol, tobacco or illicit drugs  Current medications and supplements including opioid prescriptions.  Functional ability and status Nutritional status Physical activity Advanced directives List of other physicians Hospitalizations, surgeries, and ER visits in previous 12 months Vitals Screenings to include cognitive, depression, and falls Referrals and appointments  In addition, I have reviewed and discussed with patient certain preventive protocols, quality metrics, and best practice recommendations. A written personalized care plan for preventive services as well as general preventive health recommendations were provided to patient.     AmSandrea HammondLPN   1001/[id number] Nurse Notes: None

## 2020-10-04 NOTE — Patient Instructions (Signed)
Ms. Strollo , Thank you for taking time to come for your Medicare Wellness Visit. I appreciate your ongoing commitment to your health goals. Please review the following plan we discussed and let me know if I can assist you in the future.   Screening recommendations/referrals: Colonoscopy: Cologuard done 09/02/2020 - repeat in 3 years Mammogram: Done 09/30/2020 - Repeat annually  Bone Density: Due at age 50 Recommended yearly ophthalmology/optometry visit for glaucoma screening and checkup Recommended yearly dental visit for hygiene and checkup  Vaccinations: Influenza vaccine: Declines Pneumococcal vaccine: Due Tdap vaccine: Done 09/16/2018 - Repeat in 10 years Shingles vaccine: First dose done 08/12/2020, due for second dose  Covid-19: Done 04/13/2019, 12/08/2019, & 08/30/2020  Advanced directives: Please bring a copy of your health care power of attorney and living will to the office to be added to your chart at your convenience.   Conditions/risks identified: Aim for 30 minutes of exercise or brisk walking each day, drink 6-8 glasses of water and eat lots of fruits and vegetables.   Next appointment: Follow up in one year for your annual wellness visit.   Preventive Care 40-64 Years, Female Preventive care refers to lifestyle choices and visits with your health care provider that can promote health and wellness. What does preventive care include? A yearly physical exam. This is also called an annual well check. Dental exams once or twice a year. Routine eye exams. Ask your health care provider how often you should have your eyes checked. Personal lifestyle choices, including: Daily care of your teeth and gums. Regular physical activity. Eating a healthy diet. Avoiding tobacco and drug use. Limiting alcohol use. Practicing safe sex. Taking low-dose aspirin daily starting at age 78. Taking vitamin and mineral supplements as recommended by your health care provider. What happens during  an annual well check? The services and screenings done by your health care provider during your annual well check will depend on your age, overall health, lifestyle risk factors, and family history of disease. Counseling  Your health care provider may ask you questions about your: Alcohol use. Tobacco use. Drug use. Emotional well-being. Home and relationship well-being. Sexual activity. Eating habits. Work and work Statistician. Method of birth control. Menstrual cycle. Pregnancy history. Screening  You may have the following tests or measurements: Height, weight, and BMI. Blood pressure. Lipid and cholesterol levels. These may be checked every 5 years, or more frequently if you are over 65 years old. Skin check. Lung cancer screening. You may have this screening every year starting at age 42 if you have a 30-pack-year history of smoking and currently smoke or have quit within the past 15 years. Fecal occult blood test (FOBT) of the stool. You may have this test every year starting at age 1. Flexible sigmoidoscopy or colonoscopy. You may have a sigmoidoscopy every 5 years or a colonoscopy every 10 years starting at age 68. Hepatitis C blood test. Hepatitis B blood test. Sexually transmitted disease (STD) testing. Diabetes screening. This is done by checking your blood sugar (glucose) after you have not eaten for a while (fasting). You may have this done every 1-3 years. Mammogram. This may be done every 1-2 years. Talk to your health care provider about when you should start having regular mammograms. This may depend on whether you have a family history of breast cancer. BRCA-related cancer screening. This may be done if you have a family history of breast, ovarian, tubal, or peritoneal cancers. Pelvic exam and Pap test. This may be  done every 3 years starting at age 80. Starting at age 67, this may be done every 5 years if you have a Pap test in combination with an HPV test. Bone  density scan. This is done to screen for osteoporosis. You may have this scan if you are at high risk for osteoporosis. Discuss your test results, treatment options, and if necessary, the need for more tests with your health care provider. Vaccines  Your health care provider may recommend certain vaccines, such as: Influenza vaccine. This is recommended every year. Tetanus, diphtheria, and acellular pertussis (Tdap, Td) vaccine. You may need a Td booster every 10 years. Zoster vaccine. You may need this after age 49. Pneumococcal 13-valent conjugate (PCV13) vaccine. You may need this if you have certain conditions and were not previously vaccinated. Pneumococcal polysaccharide (PPSV23) vaccine. You may need one or two doses if you smoke cigarettes or if you have certain conditions. Talk to your health care provider about which screenings and vaccines you need and how often you need them. This information is not intended to replace advice given to you by your health care provider. Make sure you discuss any questions you have with your health care provider. Document Released: 01/14/2015 Document Revised: 09/07/2015 Document Reviewed: 10/19/2014 Elsevier Interactive Patient Education  2017 [place] Prevention in the Home Falls can cause injuries. They can happen to people of all ages. There are many things you can do to make your home safe and to help prevent falls. What can I do on the outside of my home? Regularly fix the edges of walkways and driveways and fix any cracks. Remove anything that might make you trip as you walk through a door, such as a raised step or threshold. Trim any bushes or trees on the path to your home. Use bright outdoor lighting. Clear any walking paths of anything that might make someone trip, such as rocks or tools. Regularly check to see if handrails are loose or broken. Make sure that both sides of any steps have handrails. Any raised decks and  porches should have guardrails on the edges. Have any leaves, snow, or ice cleared regularly. Use sand or salt on walking paths during winter. Clean up any spills in your garage right away. This includes oil or grease spills. What can I do in the bathroom? Use night lights. Install grab bars by the toilet and in the tub and shower. Do not use towel bars as grab bars. Use non-skid mats or decals in the tub or shower. If you need to sit down in the shower, use a plastic, non-slip stool. Keep the floor dry. Clean up any water that spills on the floor as soon as it happens. Remove soap buildup in the tub or shower regularly. Attach bath mats securely with double-sided non-slip rug tape. Do not have throw rugs and other things on the floor that can make you trip. What can I do in the bedroom? Use night lights. Make sure that you have a light by your bed that is easy to reach. Do not use any sheets or blankets that are too big for your bed. They should not hang down onto the floor. Have a firm chair that has side arms. You can use this for support while you get dressed. Do not have throw rugs and other things on the floor that can make you trip. What can I do in the kitchen? Clean up any spills right away. Avoid walking  on wet floors. Keep items that you use a lot in easy-to-reach places. If you need to reach something above you, use a strong step stool that has a grab bar. Keep electrical cords out of the way. Do not use floor polish or wax that makes floors slippery. If you must use wax, use non-skid floor wax. Do not have throw rugs and other things on the floor that can make you trip. What can I do with my stairs? Do not leave any items on the stairs. Make sure that there are handrails on both sides of the stairs and use them. Fix handrails that are broken or loose. Make sure that handrails are as long as the stairways. Check any carpeting to make sure that it is firmly attached to the  stairs. Fix any carpet that is loose or worn. Avoid having throw rugs at the top or bottom of the stairs. If you do have throw rugs, attach them to the floor with carpet tape. Make sure that you have a light switch at the top of the stairs and the bottom of the stairs. If you do not have them, ask someone to add them for you. What else can I do to help prevent falls? Wear shoes that: Do not have high heels. Have rubber bottoms. Are comfortable and fit you well. Are closed at the toe. Do not wear sandals. If you use a stepladder: Make sure that it is fully opened. Do not climb a closed stepladder. Make sure that both sides of the stepladder are locked into place. Ask someone to hold it for you, if possible. Clearly mark and make sure that you can see: Any grab bars or handrails. First and last steps. Where the edge of each step is. Use tools that help you move around (mobility aids) if they are needed. These include: Canes. Walkers. Scooters. Crutches. Turn on the lights when you go into a dark area. Replace any light bulbs as soon as they burn out. Set up your furniture so you have a clear path. Avoid moving your furniture around. If any of your floors are uneven, fix them. If there are any pets around you, be aware of where they are. Review your medicines with your doctor. Some medicines can make you feel dizzy. This can increase your chance of falling. Ask your doctor what other things that you can do to help prevent falls. This information is not intended to replace advice given to you by your health care provider. Make sure you discuss any questions you have with your health care provider. Document Released: 10/14/2008 Document Revised: 05/26/2015 Document Reviewed: 01/22/2014 Elsevier Interactive Patient Education  2017 [employer].

## 2020-10-05 ENCOUNTER — Other Ambulatory Visit (HOSPITAL_COMMUNITY): Payer: Self-pay | Admitting: Family

## 2020-10-05 DIAGNOSIS — R928 Other abnormal and inconclusive findings on diagnostic imaging of breast: Secondary | ICD-10-CM

## 2020-10-25 ENCOUNTER — Ambulatory Visit (HOSPITAL_COMMUNITY): Admission: RE | Admit: 2020-10-25 | Payer: Medicare Other | Source: Ambulatory Visit

## 2020-10-25 ENCOUNTER — Inpatient Hospital Stay (HOSPITAL_COMMUNITY): Admission: RE | Admit: 2020-10-25 | Payer: Medicare Other | Source: Ambulatory Visit

## 2020-10-25 ENCOUNTER — Encounter (HOSPITAL_COMMUNITY): Payer: Self-pay

## 2020-11-22 ENCOUNTER — Other Ambulatory Visit: Payer: Self-pay

## 2020-11-22 ENCOUNTER — Ambulatory Visit (HOSPITAL_COMMUNITY)
Admission: RE | Admit: 2020-11-22 | Discharge: 2020-11-22 | Disposition: A | Discharge status: Home / Self Care | Payer: Medicare Other | Source: Ambulatory Visit | Attending: Family | Admitting: Family

## 2020-11-22 DIAGNOSIS — R928 Other abnormal and inconclusive findings on diagnostic imaging of breast: Secondary | ICD-10-CM

## 2020-11-22 DIAGNOSIS — R922 Inconclusive mammogram: Secondary | ICD-10-CM | POA: Diagnosis not present

## 2021-01-10 ENCOUNTER — Ambulatory Visit (INDEPENDENT_AMBULATORY_CARE_PROVIDER_SITE_OTHER): Payer: Medicare Other | Admitting: *Deleted

## 2021-01-10 DIAGNOSIS — Z23 Encounter for immunization: Secondary | ICD-10-CM

## 2021-02-08 DIAGNOSIS — Z923 Personal history of irradiation: Secondary | ICD-10-CM | POA: Diagnosis not present

## 2021-02-08 DIAGNOSIS — D329 Benign neoplasm of meninges, unspecified: Secondary | ICD-10-CM | POA: Diagnosis not present

## 2021-02-08 DIAGNOSIS — D420 Neoplasm of uncertain behavior of cerebral meninges: Secondary | ICD-10-CM | POA: Diagnosis not present

## 2021-02-08 DIAGNOSIS — D320 Benign neoplasm of cerebral meninges: Secondary | ICD-10-CM | POA: Diagnosis not present

## 2021-04-06 DIAGNOSIS — H5213 Myopia, bilateral: Secondary | ICD-10-CM | POA: Diagnosis not present

## 2021-05-02 IMAGING — MG DIGITAL SCREENING BILAT W/ TOMO W/ CAD
8 series · 8 of 24 positions shown · non-contrast
Comparison: Previous exam(s).

CLINICAL DATA: Screening.

EXAM:
DIGITAL SCREENING BILATERAL MAMMOGRAM WITH TOMO AND CAD

[R CC synth-2D]
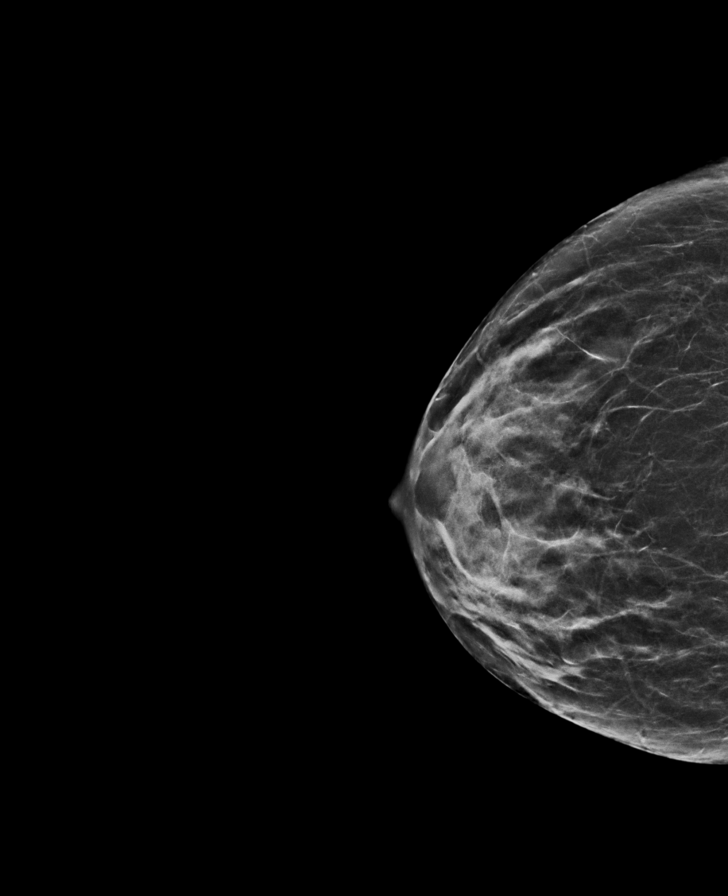

[L MLO synth-2D]
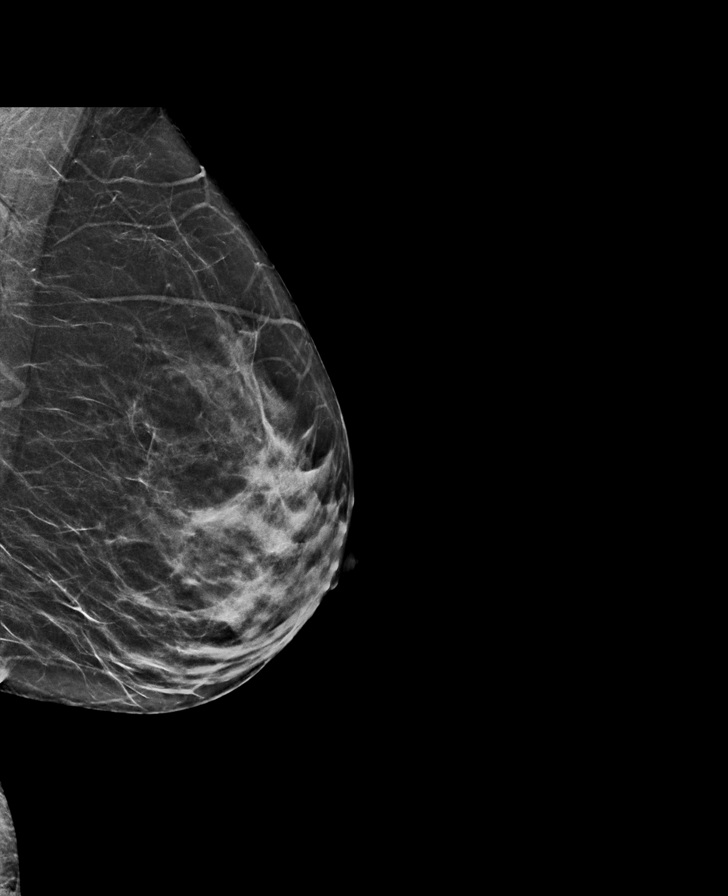

[R MLO synth-2D]
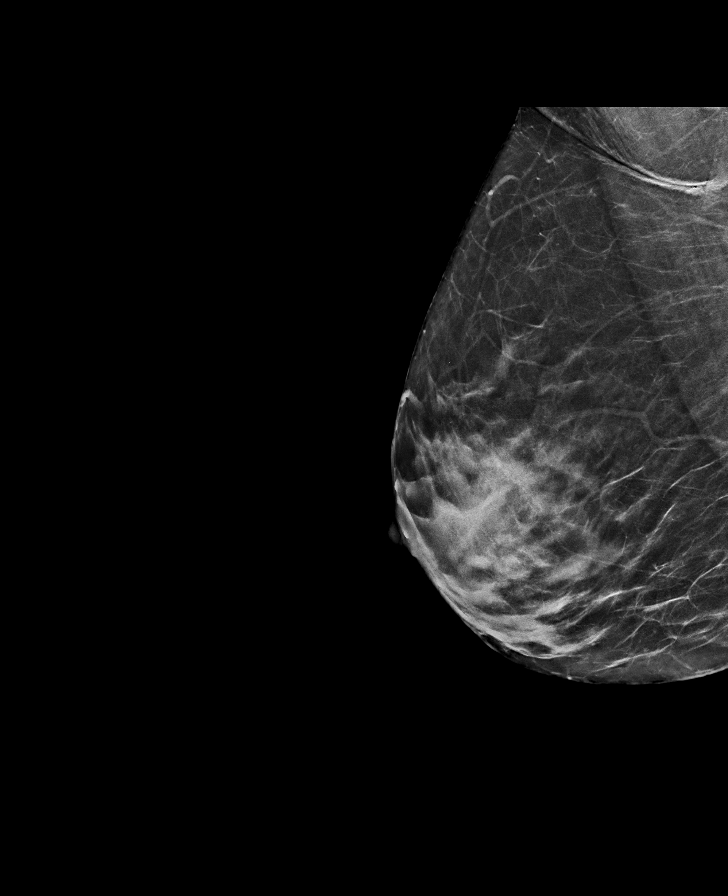

[L CC synth-2D]
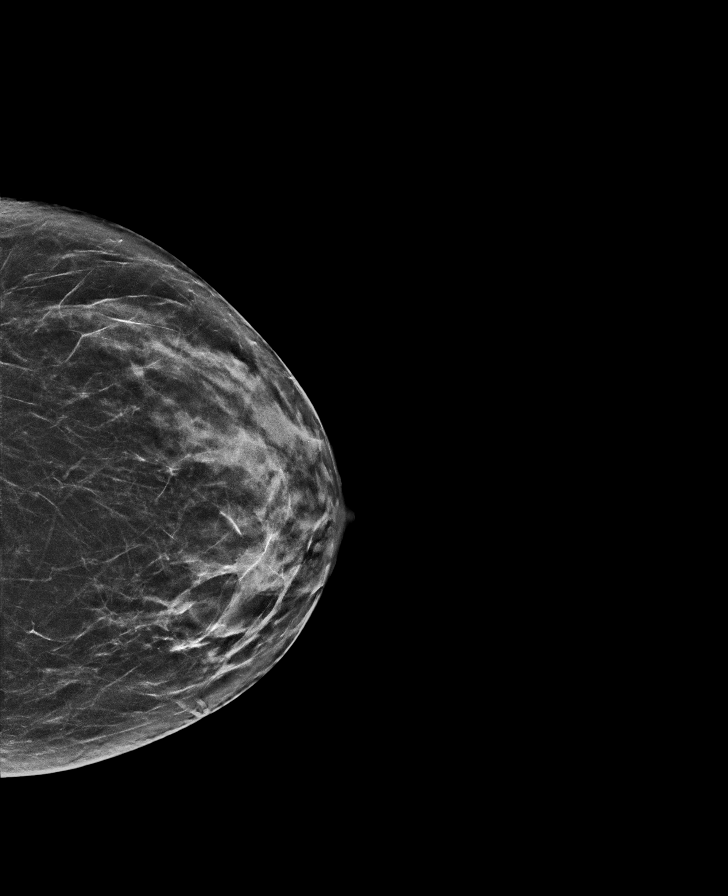

[R CC tomo · tomo slice 29/56.0]
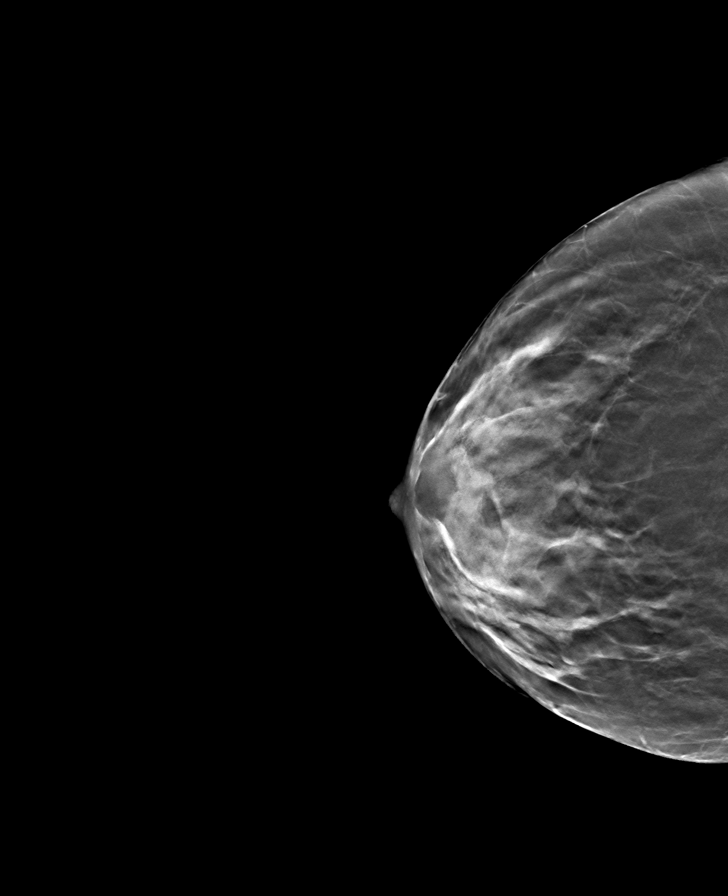

[R MLO tomo · tomo slice 31/62.0]
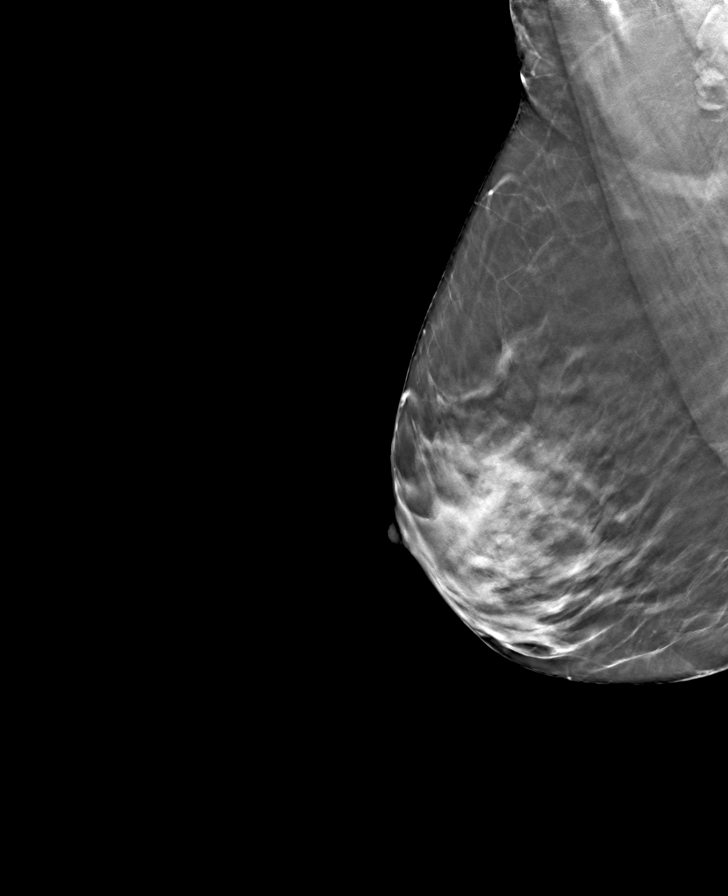

[L CC tomo · tomo slice 31/60.0]
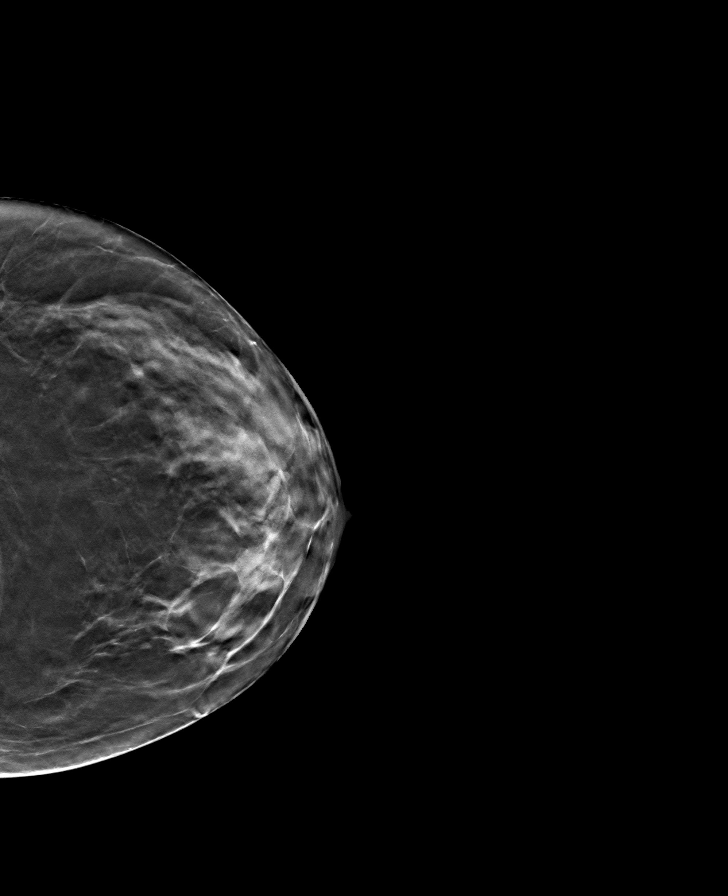

[L MLO tomo · tomo slice 29/58.0]
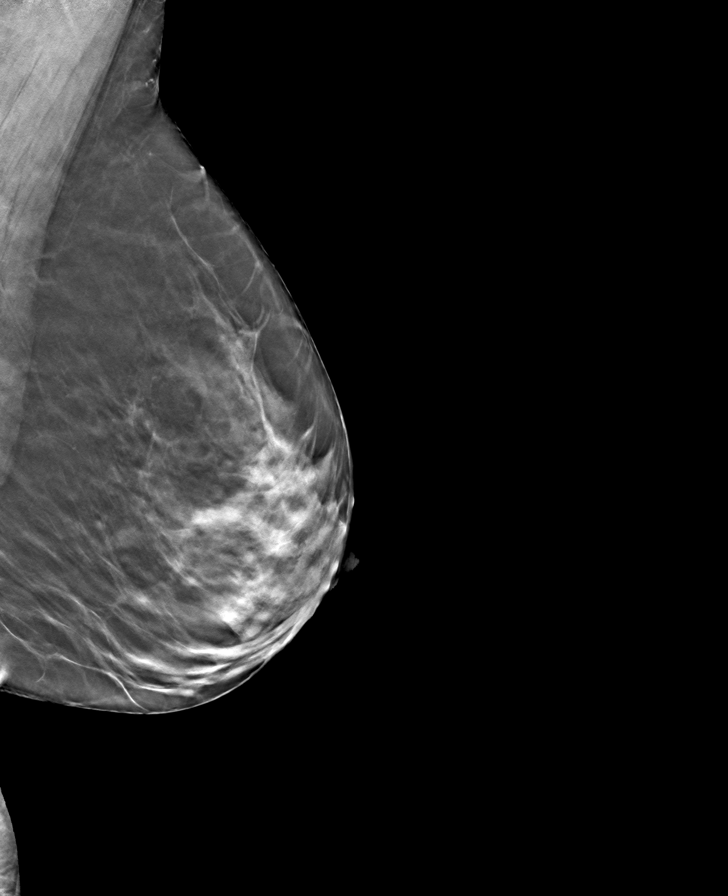

[8 of 24 positions shown; findings below may reference images not displayed]

ACR Breast Density Category c: The breast tissue is heterogeneously
dense, which may obscure small masses.
FINDINGS: There are no findings suspicious for malignancy. Images were
processed with CAD.
IMPRESSION: No mammographic evidence of malignancy. A result letter of this
screening mammogram will be mailed directly to the patient.

RECOMMENDATION:
Screening mammogram in one year. (Code:FT-U-LHB)

BI-RADS CATEGORY  1: Negative.

## 2021-05-04 DIAGNOSIS — H52223 Regular astigmatism, bilateral: Secondary | ICD-10-CM | POA: Diagnosis not present

## 2021-05-04 DIAGNOSIS — H524 Presbyopia: Secondary | ICD-10-CM | POA: Diagnosis not present

## 2021-06-10 ENCOUNTER — Other Ambulatory Visit: Payer: Self-pay | Admitting: Family

## 2021-07-18 ENCOUNTER — Ambulatory Visit (INDEPENDENT_AMBULATORY_CARE_PROVIDER_SITE_OTHER): Payer: Medicare Other | Admitting: Family

## 2021-07-18 ENCOUNTER — Encounter: Payer: Self-pay | Admitting: Family

## 2021-07-18 VITALS — BP 115/76 | HR 65 | Temp 98.2°F | Ht 66.0 in | Wt 140.4 lb

## 2021-07-18 DIAGNOSIS — M7582 Other shoulder lesions, left shoulder: Secondary | ICD-10-CM

## 2021-07-18 DIAGNOSIS — M25512 Pain in left shoulder: Secondary | ICD-10-CM | POA: Diagnosis not present

## 2021-07-18 MED ORDER — PREDNISONE 10 MG (21) PO TBPK: ORAL_TABLET | ORAL | 0 refills | Status: DC

## 2021-07-18 MED ORDER — DICLOFENAC SODIUM 75 MG PO TBEC: 75.0000 mg | DELAYED_RELEASE_TABLET | Freq: Two times a day (BID) | ORAL | 0 refills | Status: DC

## 2021-07-18 NOTE — Patient Instructions (Signed)
Rotator Cuff Tendinitis  Rotator cuff tendinitis is inflammation of the tendons in the rotator cuff. Tendons are tough, cord-like bands that connect muscle to bone. The rotator cuff includes all of the muscles and tendons that connect the arm to the shoulder. The rotator cuff holds the head of the humerus, or the upper arm bone, in the cup of the shoulder blade (scapula). This condition can lead to a long-term or chronic tear. The tear may be partial or complete. What are the causes? This condition is usually caused by overusing the rotator cuff. What increases the risk? This condition is more likely to develop in athletes and workers who frequently use their shoulder or reach over their heads. This can include activities such as: Tennis. Baseball or softball. Swimming. Construction work. Painting. What are the signs or symptoms? Symptoms of this condition include: Pain that spreads (radiates) from the shoulder to the upper arm. Swelling and tenderness in front of the shoulder. Pain when reaching, pulling, or lifting the arm above the head. Pain when lowering the arm from above the head. Minor pain in the shoulder when resting. Increased pain in the shoulder at night. Difficulty placing the arm behind the back. How is this diagnosed? This condition is diagnosed with a physical exam and medical history. Tests may also be done, including: X-rays. MRI. Ultrasound. CT with or without contrast. How is this treated? Treatment for this condition depends on the severity of the condition. In less severe cases, treatment may include: Rest. This may be done with a sling that holds the shoulder still (immobilization). Your health care provider may also recommend avoiding activities that involve lifting your arm over your head. Icing the shoulder. Anti-inflammatory medicines, such as aspirin or ibuprofen. In more severe cases, treatment may include: Physical therapy. Steroid  injections. Surgery. Follow these instructions at home: If you have a sling: Wear the sling as told by your health care provider. Remove it only as told by your health care provider. Loosen it if your fingers tingle, become numb, or turn cold and blue. Keep it clean. If the sling is not waterproof: Do not let it get wet. Cover it with a watertight covering when you take a bath or shower. Managing pain, stiffness, and swelling  If directed, put ice on the injured area. To do this: If you have a removable sling, remove it as told by your health care provider. Put ice in a plastic bag. Place a towel between your skin and the bag. Leave the ice on for 20 minutes, 2-3 times a day. Move your fingers often to reduce stiffness and swelling. Raise (elevate) the injured area above the level of your heart while you are lying down. Find a comfortable sleeping position, or sleep in a recliner, if available. Activity Rest your shoulder as told by your health care provider. Ask your health care provider when it is safe to drive if you have a sling on your arm. Return to your normal activities as told by your health care provider. Ask your health care provider what activities are safe for you. Do any exercises or stretches as told by your health care provider or physical therapist. If you do repetitive overhead tasks, take small breaks in between and include stretching exercises as told by your health care provider. General instructions Do not use any products that contain nicotine or tobacco, such as cigarettes, e-cigarettes, and chewing tobacco. These can delay healing. If you need help quitting, ask your health care provider.  Take over-the-counter and prescription medicines only as told by your health care provider. Keep all follow-up visits as told by your health care provider. This is important. Contact a health care provider if: Your pain gets worse. You have new pain in your arm, hands, or  fingers. Your pain is not relieved with medicine or does not get better after 6 weeks of treatment. You have crackling sensations when moving your shoulder in certain directions. You hear a snapping sound after using your shoulder, followed by severe pain and weakness. Get help right away if: Your arm, hand, or fingers are numb or tingling. Your arm, hand, or fingers are swollen or painful or they turn white or blue. Summary Rotator cuff tendinitis is inflammation of the tendons in the rotator cuff. Tendons are tough, cord-like bands that connect muscle to bone. This condition is usually caused by overusing the rotator cuff, which includes all of the muscles and tendons that connect the arm to the shoulder. This condition is more likely to develop in athletes and workers who frequently use their shoulder or reach over their heads. Treatment generally includes rest, anti-inflammatory medicines, and icing. In some cases, physical therapy and steroid injections may be needed. In severe cases, surgery may be needed. This information is not intended to replace advice given to you by your health care provider. Make sure you discuss any questions you have with your health care provider. Document Revised: 09/22/2018 Document Reviewed: 09/22/2018 Elsevier Patient Education  [place].

## 2021-07-18 NOTE — Progress Notes (Signed)
Subjective:    Patient ID: Sheena Willis, female    DOB: 1970/05/21, 51 y.o.   MRN: [id number]  Chief Complaint  Patient presents with   Shoulder Pain    Left     Shoulder Pain  The pain is present in the left shoulder. This is a new problem. The current episode started more than 1 month ago. There has been no history of extremity trauma. The problem occurs intermittently. The problem has been waxing and waning. The quality of the pain is described as aching (throbbing). The pain is at a severity of 8/10. The pain is moderate. Associated symptoms include a limited range of motion. Pertinent negatives include no inability to bear weight, joint locking, joint swelling or stiffness. She has tried rest and OTC ointments for the symptoms. The treatment provided mild relief.      Review of Systems  Musculoskeletal:  Negative for stiffness.  All other systems reviewed and are negative.      Objective:   Physical Exam Vitals reviewed.  Constitutional:      General: She is not in acute distress.    Appearance: She is well-developed.  HENT:     Head: Normocephalic and atraumatic.  Eyes:     Pupils: Pupils are equal, round, and reactive to light.  Neck:     Thyroid: No thyromegaly.  Cardiovascular:     Rate and Rhythm: Normal rate and regular rhythm.     Heart sounds: Normal heart sounds. No murmur heard. Pulmonary:     Effort: Pulmonary effort is normal. No respiratory distress.     Breath sounds: Normal breath sounds. No wheezing.  Abdominal:     General: Bowel sounds are normal. There is no distension.     Palpations: Abdomen is soft.     Tenderness: There is no abdominal tenderness.  Musculoskeletal:        General: Tenderness present.     Cervical back: Normal range of motion and neck supple.     Comments: Pain in left shoulder with internal rotation  Skin:    General: Skin is warm and dry.  Neurological:     Mental Status: She is alert and oriented to person, place,  and time.     Cranial Nerves: No cranial nerve deficit.     Deep Tendon Reflexes: Reflexes are normal and symmetric.  Psychiatric:        Behavior: Behavior normal.        Thought Content: Thought content normal.        Judgment: Judgment normal.       BP 115/76   Pulse 65   Temp 98.2 F (36.8 C)   Ht '5\' 6"'$  (1.676 m)   Wt 140 lb 6.4 oz (63.7 kg)   SpO2 99%   BMI 22.66 kg/m      Assessment & Plan:  [name] comes in today with chief complaint of Shoulder Pain (Left )   Diagnosis and orders addressed:  1. Acute pain of left shoulder - predniSONE (STERAPRED UNI-PAK 21 TAB) 10 MG (21) TBPK tablet; Use as directed  Dispense: 21 tablet; Refill: 0 - diclofenac (VOLTAREN) 75 MG EC tablet; Take 1 tablet (75 mg total) by mouth 2 (two) times daily.  Dispense: 30 tablet; Refill: 0  2. Tendinitis of left rotator cuff - predniSONE (STERAPRED UNI-PAK 21 TAB) 10 MG (21) TBPK tablet; Use as directed  Dispense: 21 tablet; Refill: 0 - diclofenac (VOLTAREN) 75 MG EC tablet; Take 1  tablet (75 mg total) by mouth 2 (two) times daily.  Dispense: 30 tablet; Refill: 0  Rest No other NSAIDs while taking diclofenac  Prednisone  ROM exercises  FOllow up if symptoms worsen or do not improve   Evelina Dun, FNP

## 2021-08-14 ENCOUNTER — Other Ambulatory Visit: Payer: Self-pay | Admitting: Family

## 2021-08-14 DIAGNOSIS — F411 Generalized anxiety disorder: Secondary | ICD-10-CM

## 2021-08-14 DIAGNOSIS — F419 Anxiety disorder, unspecified: Secondary | ICD-10-CM

## 2021-08-15 ENCOUNTER — Other Ambulatory Visit: Payer: Self-pay | Admitting: Family

## 2021-08-15 DIAGNOSIS — N939 Abnormal uterine and vaginal bleeding, unspecified: Secondary | ICD-10-CM

## 2021-09-17 ENCOUNTER — Other Ambulatory Visit: Payer: Self-pay | Admitting: Family

## 2021-09-17 DIAGNOSIS — F419 Anxiety disorder, unspecified: Secondary | ICD-10-CM

## 2021-09-17 DIAGNOSIS — F411 Generalized anxiety disorder: Secondary | ICD-10-CM

## 2021-09-18 NOTE — Telephone Encounter (Signed)
Appt made for 09/26/2021, has enough medication until she is seen

## 2021-09-18 NOTE — Telephone Encounter (Signed)
[place] NTBS 30 days given 08/14/21

## 2021-09-21 ENCOUNTER — Other Ambulatory Visit: Payer: Self-pay | Admitting: Family

## 2021-09-21 DIAGNOSIS — F419 Anxiety disorder, unspecified: Secondary | ICD-10-CM

## 2021-09-21 DIAGNOSIS — F411 Generalized anxiety disorder: Secondary | ICD-10-CM

## 2021-09-26 ENCOUNTER — Encounter: Payer: Self-pay | Admitting: Family

## 2021-09-26 ENCOUNTER — Ambulatory Visit (INDEPENDENT_AMBULATORY_CARE_PROVIDER_SITE_OTHER): Payer: Medicare Other | Admitting: Family

## 2021-09-26 VITALS — BP 105/72 | HR 63 | Temp 97.8°F | Ht 66.0 in | Wt 141.0 lb

## 2021-09-26 DIAGNOSIS — F411 Generalized anxiety disorder: Secondary | ICD-10-CM

## 2021-09-26 DIAGNOSIS — R079 Chest pain, unspecified: Secondary | ICD-10-CM | POA: Diagnosis not present

## 2021-09-26 DIAGNOSIS — I6990 Unspecified sequelae of unspecified cerebrovascular disease: Secondary | ICD-10-CM | POA: Diagnosis not present

## 2021-09-26 DIAGNOSIS — N939 Abnormal uterine and vaginal bleeding, unspecified: Secondary | ICD-10-CM

## 2021-09-26 DIAGNOSIS — Z0001 Encounter for general adult medical examination with abnormal findings: Secondary | ICD-10-CM

## 2021-09-26 DIAGNOSIS — Z0000 Encounter for general adult medical examination without abnormal findings: Secondary | ICD-10-CM

## 2021-09-26 DIAGNOSIS — F419 Anxiety disorder, unspecified: Secondary | ICD-10-CM

## 2021-09-26 DIAGNOSIS — D329 Benign neoplasm of meninges, unspecified: Secondary | ICD-10-CM | POA: Diagnosis not present

## 2021-09-26 MED ORDER — BUSPIRONE HCL 5 MG PO TABS: 5.0000 mg | ORAL_TABLET | Freq: Three times a day (TID) | ORAL | 0 refills | Status: DC | PRN

## 2021-09-26 MED ORDER — CITALOPRAM HYDROBROMIDE 40 MG PO TABS: 40.0000 mg | ORAL_TABLET | Freq: Every day | ORAL | 0 refills | Status: DC

## 2021-09-26 MED ORDER — ATORVASTATIN CALCIUM 40 MG PO TABS: 40.0000 mg | ORAL_TABLET | Freq: Every day | ORAL | 0 refills | Status: DC

## 2021-09-26 MED ORDER — MEDROXYPROGESTERONE ACETATE 150 MG/ML IM SUSY: PREFILLED_SYRINGE | INTRAMUSCULAR | 6 refills | Status: DC

## 2021-09-26 NOTE — Patient Instructions (Signed)

## 2021-09-26 NOTE — Progress Notes (Signed)
Subjective:    Patient ID: Sheena Willis, female    DOB: 1970/12/29, 51 y.o.   MRN: [id number]  Chief Complaint  Patient presents with   Annual Exam   Pt presents to the office today for CPE without pap. She is followed by Neurologists every 6 months for Hx CVA with right sided weakness and hx meningioma.   Hyperlipidemia This is a chronic problem. The current episode started more than 1 year ago. The problem is controlled. Recent lipid tests were reviewed and are normal. Associated symptoms include chest pain. Current antihyperlipidemic treatment includes statins. The current treatment provides moderate improvement of lipids. Risk factors for coronary artery disease include dyslipidemia, hypertension and a sedentary lifestyle.  Anxiety Presents for follow-up visit. Symptoms include chest pain, depressed mood, dizziness, excessive worry, irritability, nervous/anxious behavior and restlessness. Patient reports no nausea. Symptoms occur occasionally. The severity of symptoms is moderate.    Chest Pain  This is a new problem. The current episode started 1 to 4 weeks ago. The onset quality is gradual. The problem has been waxing and waning. The pain is present in the substernal region. The pain is mild. The quality of the pain is described as tightness. The pain does not radiate. Associated symptoms include dizziness. Pertinent negatives include no claudication, cough, lower extremity edema or nausea. The pain is aggravated by nothing. She has tried rest for the symptoms. The treatment provided mild relief.  Her past medical history is significant for hyperlipidemia.      Review of Systems  Constitutional:  Positive for irritability.  Respiratory:  Negative for cough.   Cardiovascular:  Positive for chest pain. Negative for claudication.  Gastrointestinal:  Negative for nausea.  Neurological:  Positive for dizziness.  Psychiatric/Behavioral:  The patient is nervous/anxious.   All other  systems reviewed and are negative.      Objective:   Physical Exam Vitals reviewed.  Constitutional:      General: She is not in acute distress.    Appearance: She is well-developed.  HENT:     Head: Normocephalic and atraumatic.     Right Ear: Tympanic membrane normal.     Left Ear: Tympanic membrane normal.  Eyes:     Pupils: Pupils are equal, round, and reactive to light.  Neck:     Thyroid: No thyromegaly.  Cardiovascular:     Rate and Rhythm: Normal rate and regular rhythm.     Heart sounds: Normal heart sounds. No murmur heard. Pulmonary:     Effort: Pulmonary effort is normal. No respiratory distress.     Breath sounds: Normal breath sounds. No wheezing.  Abdominal:     General: Bowel sounds are normal. There is no distension.     Palpations: Abdomen is soft.     Tenderness: There is no abdominal tenderness.  Musculoskeletal:        General: No tenderness. Normal range of motion.     Cervical back: Normal range of motion and neck supple.  Skin:    General: Skin is warm and dry.  Neurological:     Mental Status: She is alert and oriented to person, place, and time.     Cranial Nerves: No cranial nerve deficit.     Deep Tendon Reflexes: Reflexes are normal and symmetric.  Psychiatric:        Behavior: Behavior normal.        Thought Content: Thought content normal.        Judgment: Judgment normal.  BP 105/72   Pulse 63   Temp 97.8 F (36.6 C) (Temporal)   Ht $R'5\' 6"'se$  (0.172 m)   Wt 141 lb (64 kg)   BMI 22.76 kg/m      Assessment & Plan:  [name] comes in today with chief complaint of Annual Exam   Diagnosis and orders addressed:  1. GAD (generalized anxiety disorder) - citalopram (CELEXA) 40 MG tablet; Take 1 tablet (40 mg total) by mouth daily.  Dispense: 30 tablet; Refill: 0 - CMP14+EGFR - CBC with Differential/Platelet  2. Anxiety - citalopram (CELEXA) 40 MG tablet; Take 1 tablet (40 mg total) by mouth daily.  Dispense: 30  tablet; Refill: 0 - CMP14+EGFR - CBC with Differential/Platelet  3. Menstrual bleeding problem - medroxyPROGESTERone Acetate 150 MG/ML SUSY; INJECT 1 ML (150 MG TOTAL) INTO THE MUSCLE EVERY 3 (THREE) MONTHS.  Dispense: 1 mL; Refill: 6 - CMP14+EGFR - CBC with Differential/Platelet  4. Annual physical exam - CMP14+EGFR - CBC with Differential/Platelet - Lipid panel - TSH  5. Meningioma (HCC) - CMP14+EGFR - CBC with Differential/Platelet  6. Late effects of CVA (cerebrovascular accident) - CMP14+EGFR - CBC with Differential/Platelet  7. Chest pain, unspecified type - EKG 12-Lead Recommend her seeing Cardiologists    Labs pending Health Maintenance reviewed Diet and exercise encouraged  Follow up plan: 6 months    Evelina Dun, FNP

## 2021-09-27 LAB — LIPID PANEL
Chol/HDL Ratio: 2.7 ratio (ref 0.0–4.4)
Cholesterol, Total: 109 mg/dL (ref 100–199)
HDL: 40 mg/dL (ref >39)
LDL Chol Calc (NIH): 56 mg/dL (ref 0–99)
Triglycerides: 57 mg/dL (ref 0–149)
VLDL Cholesterol Cal: 13 mg/dL (ref 5–40)

## 2021-09-27 LAB — CBC WITH DIFFERENTIAL/PLATELET
Basophils Absolute: 0.1 10*3/uL (ref 0.0–0.2)
Basos: 1 %
EOS (ABSOLUTE): 0.5 10*3/uL — ABNORMAL HIGH (ref 0.0–0.4)
Eos: 7 %
Hematocrit: 40.3 % (ref 34.0–46.6)
Hemoglobin: 13.2 g/dL (ref 11.1–15.9)
Immature Grans (Abs): 0 10*3/uL (ref 0.0–0.1)
Immature Granulocytes: 0 %
Lymphocytes Absolute: 2.6 10*3/uL (ref 0.7–3.1)
Lymphs: 34 %
MCH: 28.4 pg (ref 26.6–33.0)
MCHC: 32.8 g/dL (ref 31.5–35.7)
MCV: 87 fL (ref 79–97)
Monocytes Absolute: 0.4 10*3/uL (ref 0.1–0.9)
Monocytes: 5 %
Neutrophils Absolute: 3.9 10*3/uL (ref 1.4–7.0)
Neutrophils: 53 %
Platelets: 269 10*3/uL (ref 150–450)
RBC: 4.64 x10E6/uL (ref 3.77–5.28)
RDW: 13 % (ref 11.7–15.4)
WBC: 7.5 10*3/uL (ref 3.4–10.8)

## 2021-09-27 LAB — CMP14+EGFR
ALT: 10 IU/L (ref 0–32)
AST: 10 IU/L (ref 0–40)
Albumin/Globulin Ratio: 2 (ref 1.2–2.2)
Albumin: 4.3 g/dL (ref 3.8–4.9)
Alkaline Phosphatase: 80 IU/L (ref 44–121)
BUN/Creatinine Ratio: 13 (ref 9–23)
BUN: 12 mg/dL (ref 6–24)
Bilirubin Total: 0.2 mg/dL (ref 0.0–1.2)
CO2: 23 mmol/L (ref 20–29)
Calcium: 9.7 mg/dL (ref 8.7–10.2)
Chloride: 105 mmol/L (ref 96–106)
Creatinine, Ser: 0.92 mg/dL (ref 0.57–1.00)
Globulin, Total: 2.1 g/dL (ref 1.5–4.5)
Glucose: 60 mg/dL — ABNORMAL LOW (ref 70–99)
Potassium: 4.1 mmol/L (ref 3.5–5.2)
Sodium: 143 mmol/L (ref 134–144)
Total Protein: 6.4 g/dL (ref 6.0–8.5)
eGFR: 75 mL/min/{1.73_m2} (ref >59)

## 2021-09-27 LAB — TSH: TSH: 0.724 u[IU]/mL (ref 0.450–4.500)

## 2021-10-03 ENCOUNTER — Encounter: Payer: Medicare Other | Admitting: Family

## 2021-10-20 ENCOUNTER — Other Ambulatory Visit: Payer: Self-pay | Admitting: Family

## 2021-11-13 ENCOUNTER — Other Ambulatory Visit: Payer: Self-pay | Admitting: Family

## 2021-11-13 DIAGNOSIS — F419 Anxiety disorder, unspecified: Secondary | ICD-10-CM

## 2021-11-13 DIAGNOSIS — F411 Generalized anxiety disorder: Secondary | ICD-10-CM

## 2022-02-08 ENCOUNTER — Telehealth: Payer: Self-pay | Admitting: Family

## 2022-02-08 NOTE — Telephone Encounter (Signed)
Left message for patient to call back and schedule Medicare Annual Wellness Visit (AWV) to be completed by video or phone.   Last AWV: 10/04/2020   Please schedule at anytime with Quinn     Any questions, please contact me at [phone]   Thank you,   [facility] Ambulatory Clinical Support for [place] [facility] ??[phone] or ??[phone]

## 2022-03-14 ENCOUNTER — Telehealth: Payer: Self-pay | Admitting: Family

## 2022-03-14 NOTE — Telephone Encounter (Signed)
Called patient to schedule Medicare Annual Wellness Visit (AWV). Left message for patient to call back and schedule Medicare Annual Wellness Visit (AWV).  Last date of AWV: 10/04/2020   Please schedule an appointment at any time with either Mickel Baas or [place], NHA's. .  If any questions, please contact me at [phone].  Thank you,  Colletta [place],  [place] Program Direct Dial ??[phone]

## 2022-03-27 ENCOUNTER — Ambulatory Visit: Payer: 59 | Admitting: Family

## 2022-04-03 ENCOUNTER — Encounter: Payer: Self-pay | Admitting: Family

## 2022-04-10 ENCOUNTER — Telehealth: Payer: Self-pay | Admitting: Family

## 2022-04-10 NOTE — Telephone Encounter (Signed)
Called patient to schedule Medicare Annual Wellness Visit (AWV). Unable to reach patient.  Number not in service  Last date of AWV: 10/04/2020   Please schedule an appointment at any time with either Vernona Rieger or [place], NHA's. .  If any questions, please contact me at [phone].  Thank you,  Judeth Cornfield,  AMB Clinical Support [facility] AWV Program Direct Dial ??[phone]

## 2022-04-11 ENCOUNTER — Other Ambulatory Visit: Payer: Self-pay | Admitting: Family

## 2022-04-14 ENCOUNTER — Other Ambulatory Visit: Payer: Self-pay | Admitting: Family

## 2022-04-25 ENCOUNTER — Other Ambulatory Visit: Payer: Self-pay | Admitting: Family

## 2022-04-25 ENCOUNTER — Other Ambulatory Visit (HOSPITAL_COMMUNITY): Payer: Self-pay | Admitting: Family

## 2022-04-25 DIAGNOSIS — Z1231 Encounter for screening mammogram for malignant neoplasm of breast: Secondary | ICD-10-CM

## 2022-05-02 ENCOUNTER — Ambulatory Visit (HOSPITAL_COMMUNITY)
Admission: RE | Admit: 2022-05-02 | Discharge: 2022-05-02 | Disposition: A | Discharge status: Home / Self Care | Payer: 59 | Source: Ambulatory Visit | Attending: Family | Admitting: Family

## 2022-05-02 ENCOUNTER — Encounter (HOSPITAL_COMMUNITY): Payer: Self-pay

## 2022-05-02 DIAGNOSIS — Z1231 Encounter for screening mammogram for malignant neoplasm of breast: Secondary | ICD-10-CM | POA: Diagnosis not present

## 2022-05-08 ENCOUNTER — Telehealth: Payer: Self-pay | Admitting: Family

## 2022-05-08 NOTE — Telephone Encounter (Signed)
Contacted Sheena Willis to schedule their annual wellness visit. Appointment made for 05/17/2022.  Thank you,  Judeth Cornfield,  AMB Clinical Support [facility] AWV Program Direct Dial ??[phone]

## 2022-05-17 ENCOUNTER — Ambulatory Visit (INDEPENDENT_AMBULATORY_CARE_PROVIDER_SITE_OTHER): Payer: 59

## 2022-05-17 ENCOUNTER — Other Ambulatory Visit: Payer: Self-pay | Admitting: Family

## 2022-05-17 VITALS — Ht 66.0 in | Wt 135.0 lb

## 2022-05-17 DIAGNOSIS — Z0000 Encounter for general adult medical examination without abnormal findings: Secondary | ICD-10-CM

## 2022-05-17 NOTE — Patient Instructions (Signed)
Sheena Willis , Thank you for taking time to come for your Medicare Wellness Visit. I appreciate your ongoing commitment to your health goals. Please review the following plan we discussed and let me know if I can assist you in the future.   These are the goals we discussed:  Goals      DIET - INCREASE WATER INTAKE     Try to drink 6-8 glasses of water daily.        This is a list of the screening recommended for you and due dates:  Health Maintenance  Topic Date Due   COVID-19 Vaccine (5 - 2023-24 season) 09/01/2021   Pap Smear  05/25/2022   Mammogram  05/02/2023   Medicare Annual Wellness Visit  05/17/2023   Cologuard (Stool DNA test)  09/03/2023   DTaP/Tdap/Td vaccine (5 - Td or Tdap) 09/15/2028   Hepatitis C Screening: USPSTF Recommendation to screen - Ages 107-79 yo.  Completed   HIV Screening  Completed   Zoster (Shingles) Vaccine  Completed   HPV Vaccine  Aged Out   Flu Shot  Discontinued    Advanced directives: Advance directive discussed with you today. I have provided a copy for you to complete at home and have notarized. Once this is complete please bring a copy in to our office so we can scan it into your chart.   Conditions/risks identified: Aim for 30 minutes of exercise or brisk walking, 6-8 glasses of water, and 5 servings of fruits and vegetables each day.   Next appointment: Follow up in one year for your annual wellness visit.   Preventive Care 40-64 Years, Female Preventive care refers to lifestyle choices and visits with your health care provider that can promote health and wellness. What does preventive care include? A yearly physical exam. This is also called an annual well check. Dental exams once or twice a year. Routine eye exams. Ask your health care provider how often you should have your eyes checked. Personal lifestyle choices, including: Daily care of your teeth and gums. Regular physical activity. Eating a healthy diet. Avoiding tobacco and  drug use. Limiting alcohol use. Practicing safe sex. Taking low-dose aspirin daily starting at age 62. Taking vitamin and mineral supplements as recommended by your health care provider. What happens during an annual well check? The services and screenings done by your health care provider during your annual well check will depend on your age, overall health, lifestyle risk factors, and family history of disease. Counseling  Your health care provider may ask you questions about your: Alcohol use. Tobacco use. Drug use. Emotional well-being. Home and relationship well-being. Sexual activity. Eating habits. Work and work Astronomer. Method of birth control. Menstrual cycle. Pregnancy history. Screening  You may have the following tests or measurements: Height, weight, and BMI. Blood pressure. Lipid and cholesterol levels. These may be checked every 5 years, or more frequently if you are over 54 years old. Skin check. Lung cancer screening. You may have this screening every year starting at age 1 if you have a 30-pack-year history of smoking and currently smoke or have quit within the past 15 years. Fecal occult blood test (FOBT) of the stool. You may have this test every year starting at age 47. Flexible sigmoidoscopy or colonoscopy. You may have a sigmoidoscopy every 5 years or a colonoscopy every 10 years starting at age 66. Hepatitis C blood test. Hepatitis B blood test. Sexually transmitted disease (STD) testing. Diabetes screening. This is done by checking  your blood sugar (glucose) after you have not eaten for a while (fasting). You may have this done every 1-3 years. Mammogram. This may be done every 1-2 years. Talk to your health care provider about when you should start having regular mammograms. This may depend on whether you have a family history of breast cancer. BRCA-related cancer screening. This may be done if you have a family history of breast, ovarian, tubal, or  peritoneal cancers. Pelvic exam and Pap test. This may be done every 3 years starting at age 29. Starting at age 47, this may be done every 5 years if you have a Pap test in combination with an HPV test. Bone density scan. This is done to screen for osteoporosis. You may have this scan if you are at high risk for osteoporosis. Discuss your test results, treatment options, and if necessary, the need for more tests with your health care provider. Vaccines  Your health care provider may recommend certain vaccines, such as: Influenza vaccine. This is recommended every year. Tetanus, diphtheria, and acellular pertussis (Tdap, Td) vaccine. You may need a Td booster every 10 years. Zoster vaccine. You may need this after age 16. Pneumococcal 13-valent conjugate (PCV13) vaccine. You may need this if you have certain conditions and were not previously vaccinated. Pneumococcal polysaccharide (PPSV23) vaccine. You may need one or two doses if you smoke cigarettes or if you have certain conditions. Talk to your health care provider about which screenings and vaccines you need and how often you need them. This information is not intended to replace advice given to you by your health care provider. Make sure you discuss any questions you have with your health care provider. Document Released: 01/14/2015 Document Revised: 09/07/2015 Document Reviewed: 10/19/2014 Elsevier Interactive Patient Education  2017 [employer].    Fall Prevention in the Home Falls can cause injuries. They can happen to people of all ages. There are many things you can do to make your home safe and to help prevent falls. What can I do on the outside of my home? Regularly fix the edges of walkways and driveways and fix any cracks. Remove anything that might make you trip as you walk through a door, such as a raised step or threshold. Trim any bushes or trees on the path to your home. Use bright outdoor lighting. Clear any walking  paths of anything that might make someone trip, such as rocks or tools. Regularly check to see if handrails are loose or broken. Make sure that both sides of any steps have handrails. Any raised decks and porches should have guardrails on the edges. Have any leaves, snow, or ice cleared regularly. Use sand or salt on walking paths during winter. Clean up any spills in your garage right away. This includes oil or grease spills. What can I do in the bathroom? Use night lights. Install grab bars by the toilet and in the tub and shower. Do not use towel bars as grab bars. Use non-skid mats or decals in the tub or shower. If you need to sit down in the shower, use a plastic, non-slip stool. Keep the floor dry. Clean up any water that spills on the floor as soon as it happens. Remove soap buildup in the tub or shower regularly. Attach bath mats securely with double-sided non-slip rug tape. Do not have throw rugs and other things on the floor that can make you trip. What can I do in the bedroom? Use night lights. Make sure  that you have a light by your bed that is easy to reach. Do not use any sheets or blankets that are too big for your bed. They should not hang down onto the floor. Have a firm chair that has side arms. You can use this for support while you get dressed. Do not have throw rugs and other things on the floor that can make you trip. What can I do in the kitchen? Clean up any spills right away. Avoid walking on wet floors. Keep items that you use a lot in easy-to-reach places. If you need to reach something above you, use a strong step stool that has a grab bar. Keep electrical cords out of the way. Do not use floor polish or wax that makes floors slippery. If you must use wax, use non-skid floor wax. Do not have throw rugs and other things on the floor that can make you trip. What can I do with my stairs? Do not leave any items on the stairs. Make sure that there are handrails  on both sides of the stairs and use them. Fix handrails that are broken or loose. Make sure that handrails are as long as the stairways. Check any carpeting to make sure that it is firmly attached to the stairs. Fix any carpet that is loose or worn. Avoid having throw rugs at the top or bottom of the stairs. If you do have throw rugs, attach them to the floor with carpet tape. Make sure that you have a light switch at the top of the stairs and the bottom of the stairs. If you do not have them, ask someone to add them for you. What else can I do to help prevent falls? Wear shoes that: Do not have high heels. Have rubber bottoms. Are comfortable and fit you well. Are closed at the toe. Do not wear sandals. If you use a stepladder: Make sure that it is fully opened. Do not climb a closed stepladder. Make sure that both sides of the stepladder are locked into place. Ask someone to hold it for you, if possible. Clearly mark and make sure that you can see: Any grab bars or handrails. First and last steps. Where the edge of each step is. Use tools that help you move around (mobility aids) if they are needed. These include: Canes. Walkers. Scooters. Crutches. Turn on the lights when you go into a dark area. Replace any light bulbs as soon as they burn out. Set up your furniture so you have a clear path. Avoid moving your furniture around. If any of your floors are uneven, fix them. If there are any pets around you, be aware of where they are. Review your medicines with your doctor. Some medicines can make you feel dizzy. This can increase your chance of falling. Ask your doctor what other things that you can do to help prevent falls. This information is not intended to replace advice given to you by your health care provider. Make sure you discuss any questions you have with your health care provider. Document Released: 10/14/2008 Document Revised: 05/26/2015 Document Reviewed:  01/22/2014 Elsevier Interactive Patient Education  2017 [employer].

## 2022-05-17 NOTE — Progress Notes (Signed)
Subjective:   YARIELYS DENONCOURT is a 52 y.o. female who presents for Medicare Annual (Subsequent) preventive examination. I connected with  Nydia Bouton on 05/17/22 by a audio enabled telemedicine application and verified that I am speaking with the correct person using two identifiers.  Patient Location: Home  Provider Location: Home Office  I discussed the limitations of evaluation and management by telemedicine. The patient expressed understanding and agreed to proceed.  Review of Systems     Cardiac Risk Factors include: advanced age (>54men, >48 women);dyslipidemia     Objective:    Today's Vitals   05/17/22 1356  Weight: 135 lb (61.2 kg)  Height: 5\' 6"  (1.676 m)   Body mass index is 21.79 kg/m.     05/17/2022    2:00 PM 10/04/2020    3:09 PM 09/15/2018    1:23 PM 01/09/2018    3:34 PM 07/16/2012    2:04 PM  Advanced Directives  Does Patient Have a Medical Advance Directive? No No No No Patient does not have advance directive;Patient would not like information  Would patient like information on creating a medical advance directive? No - Patient declined No - Patient declined No - Patient declined No - Patient declined   Pre-existing out of facility DNR order (yellow form or pink MOST form)     No    Current Medications (verified) Outpatient Encounter Medications as of 05/17/2022  Medication Sig   aspirin EC 81 MG tablet Take by mouth.   atorvastatin (LIPITOR) 40 MG tablet Take 1 tablet (40 mg total) by mouth daily. (NEEDS TO BE SEEN BEFORE NEXT REFILL)   busPIRone (BUSPAR) 5 MG tablet Take 1 tablet (5 mg total) by mouth 3 (three) times daily as needed. (NEEDS TO BE SEEN BEFORE NEXT REFILL)   cholecalciferol (VITAMIN D3) 25 MCG (1000 UNIT) tablet Take 1,000 Units by mouth daily.   citalopram (CELEXA) 40 MG tablet TAKE 1 TABLET BY MOUTH EVERY DAY   medroxyPROGESTERone Acetate 150 MG/ML SUSY INJECT 1 ML (150 MG TOTAL) INTO THE MUSCLE EVERY 3 (THREE) MONTHS.   Multiple  Vitamins-Minerals (MULTIVITAMINS THER. W/MINERALS) TABS Take 1 tablet by mouth daily.   vitamin C (ASCORBIC ACID) 500 MG tablet Take 500 mg by mouth daily.   No facility-administered encounter medications on file as of 05/17/2022.    Allergies (verified) Fish allergy   History: Past Medical History:  Diagnosis Date   Brain tumor (benign) (HCC)    Radiation    for brain tumors   Vertigo    due to benign brain tumor   Past Surgical History:  Procedure Laterality Date   BRAIN BIOPSY  12/05/2009   HALO APPLICATION     done before gamma knife radio surgery   LESION REMOVAL Left 07/23/2012   Procedure: EXCISION SKIN NEOPLASM LEFT ELBOW;  Surgeon: Dalia Heading, MD;  Location: [facility];  Service: General;  Laterality: Left;   MYRINGOTOMY WITH TUBE PLACEMENT Left    Dr. Suszanne Conners   Family History  Problem Relation Age of Onset   Cancer Maternal Aunt    Diabetes Maternal Aunt    Cancer Maternal Aunt    Social History   Socioeconomic History   Marital status: Divorced    Spouse name: Not on file   Number of children: 2   Years of education: 12   Highest education level: High school graduate  Occupational History   Occupation: disability  Tobacco Use   Smoking status: Former  Packs/day: .25    Types: Cigarettes    Quit date: 01/24/2018    Years since quitting: 4.3   Smokeless tobacco: Never  Vaping Use   Vaping Use: Never used  Substance and Sexual Activity   Alcohol use: No   Drug use: No   Sexual activity: Not Currently  Other Topics Concern   Not on file  Social History Narrative   Not on file   Social Determinants of Health   Financial Resource Strain: Low Risk  (05/17/2022)   Overall Financial Resource Strain (CARDIA)    Difficulty of Paying Living Expenses: Not hard at all  Food Insecurity: No Food Insecurity (05/17/2022)   Hunger Vital Sign    Worried About Running Out of Food in the Last Year: Never true    Ran Out of Food in the Last Year: Never true   Transportation Needs: No Transportation Needs (05/17/2022)   PRAPARE - Administrator, Civil Service (Medical): No    Lack of Transportation (Non-Medical): No  Physical Activity: Insufficiently Active (05/17/2022)   Exercise Vital Sign    Days of Exercise per Week: 3 days    Minutes of Exercise per Session: 30 min  Stress: No Stress Concern Present (05/17/2022)   [employer] of Occupational Health - Occupational Stress Questionnaire    Feeling of Stress : Not at all  Social Connections: Moderately Isolated (05/17/2022)   Social Connection and Isolation Panel [NHANES]    Frequency of Communication with Friends and Family: More than three times a week    Frequency of Social Gatherings with Friends and Family: More than three times a week    Attends Religious Services: 1 to 4 times per year    Active Member of [employer] or Organizations: No    Attends Engineer, structural: Never    Marital Status: Divorced    Tobacco Counseling Counseling given: Not Answered   Clinical Intake:  Pre-visit preparation completed: Yes  Pain : No/denies pain     Nutritional Risks: None Diabetes: No  How often do you need to have someone help you when you read instructions, pamphlets, or other written materials from your doctor or pharmacy?: 1 - Never  Diabetic?no   Interpreter Needed?: No  Information entered by :: Renie Ora, LPN   Activities of Daily Living    05/17/2022    2:01 PM  In your present state of health, do you have any difficulty performing the following activities:  Hearing? 0  Vision? 0  Difficulty concentrating or making decisions? 0  Walking or climbing stairs? 0  Dressing or bathing? 0  Doing errands, shopping? 0  Preparing Food and eating ? N  Using the Toilet? N  In the past six months, have you accidently leaked urine? N  Do you have problems with loss of bowel control? N  Managing your Medications? N  Managing your Finances? N   Housekeeping or managing your Housekeeping? N    Patient Care Team: Junie Spencer, FNP as PCP - General (Family Medicine) Newman Pies, MD as Consulting Physician (Otolaryngology) Shann Medal, MD as Referring Physician (Radiology) Roland Rack, MD as Referring Physician (Internal Medicine)  Indicate any recent Medical Services you may have received from other than [facility] providers in the past year (date may be approximate).     Assessment:   This is a routine wellness examination for [employer].  Hearing/Vision screen Vision Screening - Comments:: Wears rx glasses - up to date with  routine eye exams with  Dr.Johnson   Dietary issues and exercise activities discussed: Current Exercise Habits: Home exercise routine, Type of exercise: walking, Time (Minutes): 30, Frequency (Times/Week): 3, Weekly Exercise (Minutes/Week): 90, Intensity: Mild, Exercise limited by: None identified   Goals Addressed             This Visit's Progress    DIET - INCREASE WATER INTAKE   On track    Try to drink 6-8 glasses of water daily.       Depression Screen    05/17/2022    1:59 PM 07/18/2021    2:04 PM 10/04/2020    2:59 PM 08/12/2020    3:27 PM 05/25/2019    9:22 AM 09/15/2018    1:23 PM 01/24/2018    3:21 PM  PHQ 2/9 Scores  PHQ - 2 Score 0 0 0 0 0 0 0  PHQ- 9 Score   0 0       Fall Risk    05/17/2022    1:57 PM 07/18/2021    2:04 PM 10/04/2020    3:00 PM 05/25/2019    9:22 AM 09/15/2018    1:23 PM  Fall Risk   Falls in the past year? 0 0 0 0 0  Number falls in past yr: 0  0  0  Injury with Fall? 0  0  0  Risk for fall due to : No Fall Risks  Medication side effect    Follow up Falls prevention discussed  Falls prevention discussed  Falls prevention discussed  Comment     get rid of all throw rugs in the house, adequate lighting in walkways and grab bars in the bathroom    FALL RISK PREVENTION PERTAINING TO THE HOME:  Any stairs in or around the home? No  If so, are there any  without handrails? No  Home free of loose throw rugs in walkways, pet beds, electrical cords, etc? Yes  Adequate lighting in your home to reduce risk of falls? Yes   ASSISTIVE DEVICES UTILIZED TO PREVENT FALLS:  Life alert? No  Use of a cane, walker or w/c? No  Grab bars in the bathroom? No  Shower chair or bench in shower? No  Elevated toilet seat or a handicapped toilet? No          05/17/2022    2:01 PM 10/04/2020    3:02 PM 09/15/2018    1:29 PM  6CIT Screen  What Year? 0 points 0 points 0 points  What month? 0 points 0 points 0 points  What time? 0 points 0 points 0 points  Count back from 20 0 points 0 points 0 points  Months in reverse 0 points 0 points 0 points  Repeat phrase 0 points 2 points 0 points  Total Score 0 points 2 points 0 points    Immunizations Immunization History  Administered Date(s) Administered   Janssen (J&J) SARS-COV-2 Vaccination 04/13/2019   MMR 06/18/1996   Moderna Sars-Covid-2 Vaccination 04/13/2019, 12/08/2019, 08/30/2020   Td 03/21/1989, 04/25/1994, 01/31/2005   Tdap 09/16/2018   Zoster Recombinat (Shingrix) 08/12/2020, 01/10/2021    TDAP status: Up to date  Flu Vaccine status: Declined, Education has been provided regarding the importance of this vaccine but patient still declined. Advised may receive this vaccine at local pharmacy or Health Dept. Aware to provide a copy of the vaccination record if obtained from local pharmacy or Health Dept. Verbalized acceptance and understanding.  Pneumococcal vaccine status: Due, Education has  been provided regarding the importance of this vaccine. Advised may receive this vaccine at local pharmacy or Health Dept. Aware to provide a copy of the vaccination record if obtained from local pharmacy or Health Dept. Verbalized acceptance and understanding.  Covid-19 vaccine status: Completed vaccines  Qualifies for Shingles Vaccine? Yes   Zostavax completed Yes   Shingrix Completed?: Yes  Screening  Tests Health Maintenance  Topic Date Due   COVID-19 Vaccine (5 - 2023-24 season) 09/01/2021   PAP SMEAR-Modifier  05/25/2022   MAMMOGRAM  05/02/2023   Medicare Annual Wellness (AWV)  05/17/2023   Fecal DNA (Cologuard)  09/03/2023   DTaP/Tdap/Td (5 - Td or Tdap) 09/15/2028   Hepatitis C Screening  Completed   HIV Screening  Completed   Zoster Vaccines- Shingrix  Completed   HPV VACCINES  Aged Out   INFLUENZA VACCINE  Discontinued    Health Maintenance  Health Maintenance Due  Topic Date Due   COVID-19 Vaccine (5 - 2023-24 season) 09/01/2021   PAP SMEAR-Modifier  05/25/2022    Colorectal cancer screening: Type of screening: Cologuard. Completed 09/02/2020. Repeat every 3 years  Mammogram status: Completed 05/02/2022. Repeat every year  Bone Density status: Ordered not of age . Pt provided with contact info and advised to call to schedule appt.  Lung Cancer Screening: (Low Dose CT Chest recommended if Age 65-80 years, 30 pack-year currently smoking OR have quit w/in 15years.) does not qualify.   Lung Cancer Screening Referral: n/a  Additional Screening:  Hepatitis C Screening: does not qualify; Completed 05/25/2019  Vision Screening: Recommended annual ophthalmology exams for early detection of glaucoma and other disorders of the eye. Is the patient up to date with their annual eye exam?  Yes  Who is the provider or what is the name of the office in which the patient attends annual eye exams? Dr.Johnson  If pt is not established with a provider, would they like to be referred to a provider to establish care? No .   Dental Screening: Recommended annual dental exams for proper oral hygiene  Community Resource Referral / Chronic Care Management: CRR required this visit?  No   CCM required this visit?  No      Plan:     I have personally reviewed and noted the following in the patient's chart:   Medical and social history Use of alcohol, tobacco or illicit drugs   Current medications and supplements including opioid prescriptions. Patient is not currently taking opioid prescriptions. Functional ability and status Nutritional status Physical activity Advanced directives List of other physicians Hospitalizations, surgeries, and ER visits in previous 12 months Vitals Screenings to include cognitive, depression, and falls Referrals and appointments  In addition, I have reviewed and discussed with patient certain preventive protocols, quality metrics, and best practice recommendations. A written personalized care plan for preventive services as well as general preventive health recommendations were provided to patient.     Lorrene Reid, LPN   1/61/0960   Nurse Notes: Due Pneumonia vaccine

## 2022-05-18 ENCOUNTER — Other Ambulatory Visit: Payer: Self-pay | Admitting: Family

## 2022-05-21 ENCOUNTER — Other Ambulatory Visit: Payer: Self-pay | Admitting: Family

## 2022-05-21 NOTE — Telephone Encounter (Signed)
Appt scheduled for 07/09/22, pt states she can not come until then she is in [place] and she will have enough meds to last until appt

## 2022-05-21 NOTE — Telephone Encounter (Signed)
[place] pt NTBS 30-d given 04/16/22

## 2022-06-01 ENCOUNTER — Other Ambulatory Visit: Payer: Self-pay | Admitting: Family

## 2022-06-16 ENCOUNTER — Other Ambulatory Visit: Payer: Self-pay | Admitting: Family

## 2022-06-28 ENCOUNTER — Other Ambulatory Visit: Payer: Self-pay | Admitting: Family

## 2022-07-09 ENCOUNTER — Other Ambulatory Visit: Payer: Self-pay | Admitting: Family Medicine

## 2022-07-09 ENCOUNTER — Ambulatory Visit (INDEPENDENT_AMBULATORY_CARE_PROVIDER_SITE_OTHER): Payer: 59 | Admitting: Family

## 2022-07-09 ENCOUNTER — Encounter: Payer: Self-pay | Admitting: Family

## 2022-07-09 VITALS — BP 96/69 | HR 65 | Temp 98.3°F | Ht <= 58 in | Wt 140.8 lb

## 2022-07-09 DIAGNOSIS — F411 Generalized anxiety disorder: Secondary | ICD-10-CM | POA: Diagnosis not present

## 2022-07-09 DIAGNOSIS — Z0000 Encounter for general adult medical examination without abnormal findings: Secondary | ICD-10-CM

## 2022-07-09 DIAGNOSIS — E785 Hyperlipidemia, unspecified: Secondary | ICD-10-CM

## 2022-07-09 DIAGNOSIS — I6990 Unspecified sequelae of unspecified cerebrovascular disease: Secondary | ICD-10-CM | POA: Diagnosis not present

## 2022-07-09 DIAGNOSIS — Z0001 Encounter for general adult medical examination with abnormal findings: Secondary | ICD-10-CM

## 2022-07-09 DIAGNOSIS — S63501A Unspecified sprain of right wrist, initial encounter: Secondary | ICD-10-CM

## 2022-07-09 DIAGNOSIS — D329 Benign neoplasm of meninges, unspecified: Secondary | ICD-10-CM

## 2022-07-09 HISTORY — DX: Hyperlipidemia, unspecified: E78.5

## 2022-07-09 MED ORDER — DICLOFENAC SODIUM 75 MG PO TBEC
75.0000 mg | DELAYED_RELEASE_TABLET | Freq: Two times a day (BID) | ORAL | 0 refills | Status: DC
Start: 2022-07-09 — End: 2022-07-23

## 2022-07-09 MED ORDER — DICLOFENAC SODIUM 1 % EX GEL
2.0000 g | Freq: Four times a day (QID) | CUTANEOUS | 1 refills | Status: DC
Start: 2022-07-09 — End: 2023-01-08

## 2022-07-09 NOTE — Progress Notes (Addendum)
Subjective:    Patient ID: Sheena Willis, female    DOB: Jun 26, 1970, 52 y.o.   MRN: [id number]  Chief Complaint  Patient presents with   Medical Management of Chronic Issues    Discuss if she should takr asa 81mg     Wrist Pain    Right wrist pain no injury brace helps a little    Pt presents to the office today for CPE without pap. She is followed by Neurologists annually for Hx CVA with right sided weakness and hx meningioma.    Wrist Pain  The pain is present in the right wrist. This is a new problem. The current episode started in the past 7 days. History of extremity trauma: after braiding her hair. The problem occurs constantly. The quality of the pain is described as aching. The pain is at a severity of 7/10. The pain is moderate. Associated symptoms include a limited range of motion. Pertinent negatives include no numbness or stiffness. She has tried nothing for the symptoms. The treatment provided no relief.  Anxiety Presents for follow-up visit. Symptoms include excessive worry, irritability, nervous/anxious behavior and restlessness. Symptoms occur occasionally. The severity of symptoms is moderate.    Hyperlipidemia This is a chronic problem. The current episode started more than 1 year ago. The problem is controlled. Recent lipid tests were reviewed and are normal. Current antihyperlipidemic treatment includes statins. The current treatment provides moderate improvement of lipids. Risk factors for coronary artery disease include dyslipidemia, hypertension and a sedentary lifestyle.      Review of Systems  Constitutional:  Positive for irritability.  Musculoskeletal:  Negative for stiffness.  Neurological:  Negative for numbness.  Psychiatric/Behavioral:  The patient is nervous/anxious.   All other systems reviewed and are negative.  Family History  Problem Relation Age of Onset   Cancer Maternal Aunt    Diabetes Maternal Aunt    Cancer Maternal Aunt    Social  History   Socioeconomic History   Marital status: Divorced    Spouse name: Not on file   Number of children: 2   Years of education: 12   Highest education level: High school graduate  Occupational History   Occupation: disability  Tobacco Use   Smoking status: Former    Packs/day: .25    Types: Cigarettes    Quit date: 01/24/2018    Years since quitting: 4.4   Smokeless tobacco: Never  Vaping Use   Vaping Use: Never used  Substance and Sexual Activity   Alcohol use: No   Drug use: No   Sexual activity: Not Currently  Other Topics Concern   Not on file  Social History Narrative   Not on file   Social Determinants of Health   Financial Resource Strain: Low Risk  (05/17/2022)   Overall Financial Resource Strain (CARDIA)    Difficulty of Paying Living Expenses: Not hard at all  Food Insecurity: No Food Insecurity (05/17/2022)   Hunger Vital Sign    Worried About Running Out of Food in the Last Year: Never true    Ran Out of Food in the Last Year: Never true  Transportation Needs: No Transportation Needs (05/17/2022)   PRAPARE - Administrator, Civil Service (Medical): No    Lack of Transportation (Non-Medical): No  Physical Activity: Insufficiently Active (05/17/2022)   Exercise Vital Sign    Days of Exercise per Week: 3 days    Minutes of Exercise per Session: 30 min  Stress: No Stress Concern  Present (05/17/2022)   [employer] of Occupational Health - Occupational Stress Questionnaire    Feeling of Stress : Not at all  Social Connections: Moderately Isolated (05/17/2022)   Social Connection and Isolation Panel [NHANES]    Frequency of Communication with Friends and Family: More than three times a week    Frequency of Social Gatherings with Friends and Family: More than three times a week    Attends Religious Services: 1 to 4 times per year    Active Member of [employer] or Organizations: No    Attends Banker Meetings: Never    Marital Status:  Divorced       Objective:   Physical Exam Vitals reviewed.  Constitutional:      General: She is not in acute distress.    Appearance: She is well-developed.  HENT:     Head: Normocephalic and atraumatic.     Right Ear: Tympanic membrane normal.     Left Ear: Tympanic membrane normal.  Eyes:     Pupils: Pupils are equal, round, and reactive to light.  Neck:     Thyroid: No thyromegaly.  Cardiovascular:     Rate and Rhythm: Normal rate and regular rhythm.     Heart sounds: Normal heart sounds. No murmur heard. Pulmonary:     Effort: Pulmonary effort is normal. No respiratory distress.     Breath sounds: Normal breath sounds. No wheezing.  Abdominal:     General: Bowel sounds are normal. There is no distension.     Palpations: Abdomen is soft.     Tenderness: There is no abdominal tenderness.  Musculoskeletal:        General: Tenderness present.     Cervical back: Normal range of motion and neck supple.     Comments: Right wrist pain with flexion and rotation  Skin:    General: Skin is warm and dry.  Neurological:     Mental Status: She is alert and oriented to person, place, and time.     Cranial Nerves: No cranial nerve deficit.     Deep Tendon Reflexes: Reflexes are normal and symmetric.  Psychiatric:        Behavior: Behavior normal.        Thought Content: Thought content normal.        Judgment: Judgment normal.        BP 96/69   Pulse 65   Temp 98.3 F (36.8 C) (Temporal)   Ht 2' (0.61 m)   Wt 140 lb 12.8 oz (63.9 kg)   SpO2 99%   BMI 171.86 kg/m   Assessment & Plan:   Sheena Willis comes in today with chief complaint of Medical Management of Chronic Issues (Discuss if she should takr asa 81mg  ) and Wrist Pain (Right wrist pain no injury brace helps a little )   Diagnosis and orders addressed:  1. GAD (generalized anxiety disorder) - CMP14+EGFR - CBC with Differential/Platelet  2. Late effects of CVA (cerebrovascular accident) -  CMP14+EGFR - CBC with Differential/Platelet  3. Meningioma (HCC) - CMP14+EGFR - CBC with Differential/Platelet  4. Hyperlipidemia, unspecified hyperlipidemia type - CMP14+EGFR - CBC with Differential/Platelet  5. Sprain of right wrist, initial encounter Start diclofenac BID with food  No other NSAID's  - diclofenac (VOLTAREN) 75 MG EC tablet; Take 1 tablet (75 mg total) by mouth 2 (two) times daily.  Dispense: 30 tablet; Refill: 0 - diclofenac Sodium (VOLTAREN) 1 % GEL; Apply 2 g topically 4 (four) times daily.  Dispense: 150 g; Refill: 1 - CMP14+EGFR - CBC with Differential/Platelet  6. Annual physical exam - CMP14+EGFR - CBC with Differential/Platelet - Lipid panel - TSH   Labs pending Health Maintenance reviewed Diet and exercise encouraged  Follow up plan: 6 months    Jannifer Rodney, FNP

## 2022-07-09 NOTE — Patient Instructions (Signed)
Wrist Sprain, Adult A wrist sprain is a stretch or tear in the strong tissues that connect the wrist bones to each other. These strong tissues are called ligaments. There are three types of wrist sprains: Grade 1. The ligament is stretched more than normal. There may be a minor amount of wrist pain. Grade 2. The ligament is partially torn. You may be able to move your wrist, but not very much. There may be a moderate amount of wrist pain. Grade 3. The ligament or ligaments are completely torn. You may find it difficult to move your wrist even a little. There may be a significant amount of wrist pain. What are the causes? This condition may be caused by using the wrist too much during sports, exercise, or work. It can also happen due to a fall or during an accident. What increases the risk? You are more likely to develop this condition if: You had a previous wrist or arm injury. You have poor wrist strength and flexibility. You play contact sports, such as football or soccer. You participate in sports that may result in a fall, such as skateboarding, biking, skiing, or snowboarding. You do not exercise regularly. You use exercise equipment that does not fit well. What are the signs or symptoms? Symptoms of this condition include: Pain in the wrist, arm, or hand. Swelling or bruised skin near the wrist, hand, or arm. The skin may look yellow or blue. Stiffness or trouble moving the hand. Hearing a noise, like a pop or a snap, at the time of injury, or feeling a tear at the time of the injury. A warm feeling in the skin around the wrist. How is this diagnosed? This condition is diagnosed with a physical exam. Sometimes an X-ray is taken to make sure a bone did not break. You may also have an MRI of your wrist to check for torn ligaments. How is this treated? This condition is treated by resting and applying ice to your wrist. Additional treatment may include: Taking medicine for pain and  inflammation. Wearing a splint, brace, or cast for a short period of time to keep your wrist from moving (immobilized). Doing exercises to strengthen and stretch your wrist. Having surgery. This may be done if the ligament is completely torn. Follow these instructions at home: If you have a splint or brace: Wear the splint or brace as told by your health care provider. Remove it only as told by your health care provider. Loosen it if your fingers tingle, become numb, or turn cold and blue. Keep it clean. If the splint or brace is not waterproof: Do not let it get wet. Cover it with a watertight covering when you take a bath or a shower. If you have a cast: Do not put pressure on any part of the cast until it is fully hardened. This may take several hours. Do not stick anything inside the cast to scratch your skin. Doing that increases your risk of infection. Check the skin around the cast every day. Tell your health care provider about any concerns. You may put lotion on dry skin around the edges of the cast. Do not put lotion on the skin underneath the cast. Keep it clean. If the cast is not waterproof: Do not let it get wet. Cover it with a watertight covering when you take a bath or shower. Managing pain, stiffness, and swelling  If directed, put ice on the injured area. To do this: If you have a   removable splint or brace, remove it as told by your health care provider. Put ice in a plastic bag. Place a towel between your skin and the bag or between the splint or cast and the bag. Leave the ice on for 20 minutes, 2-3 times a day. Remove the ice if your skin turns bright red. This is very important. If you cannot feel pain, heat, or cold, you have a greater risk of damage to the area. Move your fingers often to reduce stiffness and swelling. Raise (elevate) the injured area above the level of your heart while you are sitting or lying down. Activity Rest your wrist as told by your  health care provider. Do not do things that cause pain. Ask your health care provider when it is safe to drive if you have a splint, brace, or cast on your wrist. Do exercises as told by your health care provider. Return to your normal activities as told by your health care provider. Ask your health care provider what activities are safe for you. General instructions Take over-the-counter and prescription medicines only as told by your health care provider. Do not use any products that contain nicotine or tobacco, such as cigarettes, e-cigarettes, and chewing tobacco. These can delay healing. If you need help quitting, ask your health care provider. Keep all follow-up visits. This is important. Contact a health care provider if: Your pain, bruising, or swelling gets worse. Your skin becomes red, gets a rash, or has open sores. Your pain does not get better or it gets worse. Get help right away if: You have a new or sudden sharp pain in the hand, arm, or wrist. You have tingling or numbness in your hand. Your fingers turn white, very red, or cold and blue. You cannot move your fingers. Summary A wrist sprain is damage to ligaments in your wrist. Wrist sprains can range from mild to severe. Return to your normal activities as told by your health care provider. Ask your health care provider what activities are safe for you. You may need to wear a splint, brace, or cast for a short period of time. This information is not intended to replace advice given to you by your health care provider. Make sure you discuss any questions you have with your health care provider. Document Revised: 04/27/2019 Document Reviewed: 04/27/2019 Elsevier Patient Education  2024 Elsevier Inc[employer]  

## 2022-07-10 LAB — CBC WITH DIFFERENTIAL/PLATELET
Basophils Absolute: 0.1 10*3/uL (ref 0.0–0.2)
Basos: 1 %
EOS (ABSOLUTE): 0.3 10*3/uL (ref 0.0–0.4)
Eos: 4 %
Hematocrit: 41 % (ref 34.0–46.6)
Hemoglobin: 13.4 g/dL (ref 11.1–15.9)
Immature Grans (Abs): 0 10*3/uL (ref 0.0–0.1)
Immature Granulocytes: 0 %
Lymphocytes Absolute: 2.8 10*3/uL (ref 0.7–3.1)
Lymphs: 30 %
MCH: 28.5 pg (ref 26.6–33.0)
MCHC: 32.7 g/dL (ref 31.5–35.7)
MCV: 87 fL (ref 79–97)
Monocytes Absolute: 0.4 10*3/uL (ref 0.1–0.9)
Monocytes: 4 %
Neutrophils Absolute: 5.7 10*3/uL (ref 1.4–7.0)
Neutrophils: 61 %
Platelets: 207 10*3/uL (ref 150–450)
RBC: 4.71 x10E6/uL (ref 3.77–5.28)
RDW: 13.5 % (ref 11.7–15.4)
WBC: 9.4 10*3/uL (ref 3.4–10.8)

## 2022-07-10 LAB — CMP14+EGFR
ALT: 8 IU/L (ref 0–32)
AST: 11 IU/L (ref 0–40)
Albumin: 4 g/dL (ref 3.8–4.9)
Alkaline Phosphatase: 88 IU/L (ref 44–121)
BUN/Creatinine Ratio: 8 — ABNORMAL LOW (ref 9–23)
BUN: 7 mg/dL (ref 6–24)
Bilirubin Total: 0.2 mg/dL (ref 0.0–1.2)
CO2: 20 mmol/L (ref 20–29)
Calcium: 9.5 mg/dL (ref 8.7–10.2)
Chloride: 103 mmol/L (ref 96–106)
Creatinine, Ser: 0.92 mg/dL (ref 0.57–1.00)
Globulin, Total: 2.2 g/dL (ref 1.5–4.5)
Glucose: 94 mg/dL (ref 70–99)
Potassium: 4.2 mmol/L (ref 3.5–5.2)
Sodium: 139 mmol/L (ref 134–144)
Total Protein: 6.2 g/dL (ref 6.0–8.5)
eGFR: 75 mL/min/{1.73_m2} (ref >59)

## 2022-07-10 LAB — LIPID PANEL
Chol/HDL Ratio: 3.3 ratio (ref 0.0–4.4)
Cholesterol, Total: 131 mg/dL (ref 100–199)
HDL: 40 mg/dL (ref >39)
LDL Chol Calc (NIH): 77 mg/dL (ref 0–99)
Triglycerides: 69 mg/dL (ref 0–149)
VLDL Cholesterol Cal: 14 mg/dL (ref 5–40)

## 2022-07-10 LAB — TSH: TSH: 0.645 u[IU]/mL (ref 0.450–4.500)

## 2022-07-13 ENCOUNTER — Other Ambulatory Visit: Payer: Self-pay | Admitting: Family

## 2022-07-21 ENCOUNTER — Other Ambulatory Visit: Payer: Self-pay | Admitting: Family

## 2022-07-21 DIAGNOSIS — S63501A Unspecified sprain of right wrist, initial encounter: Secondary | ICD-10-CM

## 2022-09-27 ENCOUNTER — Other Ambulatory Visit: Payer: Self-pay | Admitting: Family

## 2022-10-02 ENCOUNTER — Other Ambulatory Visit: Payer: Self-pay | Admitting: Family

## 2022-10-02 DIAGNOSIS — N926 Irregular menstruation, unspecified: Secondary | ICD-10-CM

## 2022-11-26 ENCOUNTER — Other Ambulatory Visit: Payer: Self-pay

## 2022-11-26 ENCOUNTER — Telehealth: Payer: Self-pay | Admitting: *Deleted

## 2022-11-26 DIAGNOSIS — F411 Generalized anxiety disorder: Secondary | ICD-10-CM

## 2022-11-26 DIAGNOSIS — F419 Anxiety disorder, unspecified: Secondary | ICD-10-CM

## 2022-11-26 MED ORDER — CITALOPRAM HYDROBROMIDE 40 MG PO TABS
40.0000 mg | ORAL_TABLET | Freq: Every day | ORAL | 0 refills | Status: DC
Start: 2022-11-26 — End: 2023-01-08

## 2022-11-26 NOTE — Telephone Encounter (Signed)
Pt seen in July. Has f/u in January.  63m supply sent to [facility].

## 2022-11-26 NOTE — Telephone Encounter (Signed)
Copied from CRM [id number]. Topic: Clinical - Medication Refill >> Nov 26, 2022  4:03 PM Theodis Sato wrote: Most Recent Primary Care Visit:  Provider: Jannifer Rodney A  Department: [facility]  Visit Type: OFFICE VISIT  Date: 07/09/2022  Medication: citalopram (CELEXA) 40 MG tablet  Has the patient contacted their pharmacy? No, pt states she is out of re-fills (Agent: If no, request that the patient contact the pharmacy for the refill. If patient does not wish to contact the pharmacy document the reason why and proceed with request.) (Agent: If yes, when and what did the pharmacy advise?)  Is this the correct pharmacy for this prescription? Yes If no, delete pharmacy and type the correct one.  This is the patient's preferred pharmacy:  [id number] - [place], [place] - [address] [place] [address] Phone: [phone] Fax: [phone]   Has the prescription been filled recently? Yes  Is the patient out of the medication? Yes  Has the patient been seen for an appointment in the last year OR does the patient have an upcoming appointment? Yes  Can we respond through MyChart? NO  Agent: Please be advised that Rx refills may take up to 3 business days. We ask that you follow-up with your pharmacy.

## 2022-11-28 ENCOUNTER — Ambulatory Visit (INDEPENDENT_AMBULATORY_CARE_PROVIDER_SITE_OTHER): Payer: 59 | Admitting: Family Medicine

## 2022-11-28 ENCOUNTER — Encounter: Payer: Self-pay | Admitting: Family Medicine

## 2022-11-28 VITALS — BP 102/60 | HR 70 | Temp 97.8°F | Ht 66.0 in | Wt 139.0 lb

## 2022-11-28 DIAGNOSIS — M898X9 Other specified disorders of bone, unspecified site: Secondary | ICD-10-CM | POA: Diagnosis not present

## 2022-11-28 DIAGNOSIS — R1031 Right lower quadrant pain: Secondary | ICD-10-CM

## 2022-11-28 DIAGNOSIS — R1011 Right upper quadrant pain: Secondary | ICD-10-CM | POA: Diagnosis not present

## 2022-11-28 DIAGNOSIS — M542 Cervicalgia: Secondary | ICD-10-CM | POA: Diagnosis not present

## 2022-11-28 DIAGNOSIS — R6889 Other general symptoms and signs: Secondary | ICD-10-CM | POA: Diagnosis not present

## 2022-11-28 DIAGNOSIS — E785 Hyperlipidemia, unspecified: Secondary | ICD-10-CM | POA: Diagnosis not present

## 2022-11-28 NOTE — Progress Notes (Signed)
Subjective:  Patient ID: Sheena Willis, female    DOB: 1970-04-27, 52 y.o.   MRN: [id number]  Patient Care Team: Junie Spencer, FNP as PCP - General (Family Medicine) Newman Pies, MD as Consulting Physician (Otolaryngology) Shann Medal, MD as Referring Physician (Radiology) Roland Rack, MD as Referring Physician (Internal Medicine)   Chief Complaint:  throat chest stomach (Left side throat fullness, dysphagia/Stomach tightness, stabbing pain/Bump on chest)   HPI: Sheena Willis is a 52 y.o. female presenting on 11/28/2022 for throat chest stomach (Left side throat fullness, dysphagia/Stomach tightness, stabbing pain/Bump on chest)  Throat  States that he has history of meningoma and wonders if these symptoms are related. She states that she is "choking" while trying to swallow. Noticed it months ago, but has worsened in the last few months. States that she has choked on spit and food. States that she tried 7 years ago to see ENT and he did not believe her symptoms. Reports that she was not able to complete swallow test with that ENT. She does not identify anything that makes it better. She has not tried OTC medications or at home therapies. She reports globus sensation, however she denies heartburn, belching, does not correlate to meals, is not worse with lying down.    2. Nodule on chest  States that she has a "marble" on her chest and has pain and shortness of breath from it. Noticed it a couple of months ago. Does not identify anything that makes it better or worse, has not changed in size or character. Describes that pain as sharp/shooting that will interrupt breathing every now and then.   3. Abdominal Pain States that her stomach is enlarged in her stomach. States that she has felt a stabbing pain in RLQ. States that she has never felt fully recovered sensation from past CVA's and feels that this side of her abdomen/flank is still numb. States that the sensation  radiates to her back. States that it pulls when she lies on her left side. Noticed it a couple of months ago. Nothing that she knows of makes it better or worse.    Relevant past medical, surgical, family, and social history reviewed and updated as indicated.  Allergies and medications reviewed and updated. Data reviewed: Chart in Epic.   Past Medical History:  Diagnosis Date   Brain tumor (benign) (HCC)    Radiation    for brain tumors   Vertigo    due to benign brain tumor    Past Surgical History:  Procedure Laterality Date   BRAIN BIOPSY  12/05/2009   HALO APPLICATION     done before gamma knife radio surgery   LESION REMOVAL Left 07/23/2012   Procedure: EXCISION SKIN NEOPLASM LEFT ELBOW;  Surgeon: Dalia Heading, MD;  Location: [facility];  Service: General;  Laterality: Left;   MYRINGOTOMY WITH TUBE PLACEMENT Left    Dr. Suszanne Conners    Social History   Socioeconomic History   Marital status: Divorced    Spouse name: Not on file   Number of children: 2   Years of education: 12   Highest education level: High school graduate  Occupational History   Occupation: disability  Tobacco Use   Smoking status: Former    Current packs/day: 0.00    Types: Cigarettes    Quit date: 01/24/2018    Years since quitting: 4.8   Smokeless tobacco: Never  Vaping Use   Vaping status: Never Used  Substance and Sexual Activity   Alcohol use: No   Drug use: No   Sexual activity: Not Currently  Other Topics Concern   Not on file  Social History Narrative   Not on file   Social Determinants of Health   Financial Resource Strain: Low Risk  (05/17/2022)   Overall Financial Resource Strain (CARDIA)    Difficulty of Paying Living Expenses: Not hard at all  Food Insecurity: No Food Insecurity (05/17/2022)   Hunger Vital Sign    Worried About Running Out of Food in the Last Year: Never true    Ran Out of Food in the Last Year: Never true  Transportation Needs: No Transportation Needs (05/17/2022)    PRAPARE - Administrator, Civil Service (Medical): No    Lack of Transportation (Non-Medical): No  Physical Activity: Insufficiently Active (05/17/2022)   Exercise Vital Sign    Days of Exercise per Week: 3 days    Minutes of Exercise per Session: 30 min  Stress: No Stress Concern Present (05/17/2022)   [employer] of Occupational Health - Occupational Stress Questionnaire    Feeling of Stress : Not at all  Social Connections: Moderately Isolated (05/17/2022)   Social Connection and Isolation Panel [NHANES]    Frequency of Communication with Friends and Family: More than three times a week    Frequency of Social Gatherings with Friends and Family: More than three times a week    Attends Religious Services: 1 to 4 times per year    Active Member of [employer] or Organizations: No    Attends Banker Meetings: Never    Marital Status: Divorced  Catering manager Violence: Not At Risk (05/17/2022)   Humiliation, Afraid, Rape, and Kick questionnaire    Fear of Current or Ex-Partner: No    Emotionally Abused: No    Physically Abused: No    Sexually Abused: No    Outpatient Encounter Medications as of 11/28/2022  Medication Sig   aspirin EC 81 MG tablet Take by mouth.   atorvastatin (LIPITOR) 40 MG tablet TAKE 1 TABLET BY MOUTH EVERY DAY   busPIRone (BUSPAR) 5 MG tablet TAKE 1 TABLET BY MOUTH THREE TIMES A DAY   cholecalciferol (VITAMIN D3) 25 MCG (1000 UNIT) tablet Take 1,000 Units by mouth daily.   citalopram (CELEXA) 40 MG tablet Take 1 tablet (40 mg total) by mouth daily.   diclofenac (VOLTAREN) 75 MG EC tablet TAKE 1 TABLET BY MOUTH TWICE A DAY   diclofenac Sodium (VOLTAREN) 1 % GEL Apply 2 g topically 4 (four) times daily.   Multiple Vitamins-Minerals (MULTIVITAMINS THER. W/MINERALS) TABS Take 1 tablet by mouth daily.   vitamin C (ASCORBIC ACID) 500 MG tablet Take 500 mg by mouth daily.   [DISCONTINUED] medroxyPROGESTERone Acetate 150 MG/ML SUSY INJECT 1 ML  (150 MG TOTAL) INTO THE MUSCLE EVERY 3 (THREE) MONTHS   No facility-administered encounter medications on file as of 11/28/2022.    Allergies  Allergen Reactions   Fish Allergy Rash    Scallops only     Review of Systems As Per HPI  Objective:  BP 102/60   Pulse 70   Temp 97.8 F (36.6 C)   Ht 5\' 6"  (1.676 m)   Wt 139 lb (63 kg)   LMP  (Exact Date)   SpO2 99%   BMI 22.44 kg/m    Wt Readings from Last 3 Encounters:  07/09/22 140 lb 12.8 oz (63.9 kg)  05/17/22 135 lb (61.2 kg)  09/26/21 141 lb (64 kg)   Physical Exam Constitutional:      General: She is awake. She is not in acute distress.    Appearance: Normal appearance. She is well-developed and well-groomed. She is not ill-appearing, toxic-appearing or diaphoretic.  Neck:     Thyroid: No thyroid mass or thyromegaly.     Trachea: Trachea and phonation normal.      Comments: Tender to palpation along left cervical chain, no palpable edema, nodules Cardiovascular:     Rate and Rhythm: Normal rate and regular rhythm.     Pulses: Normal pulses.          Radial pulses are 2+ on the right side and 2+ on the left side.       Posterior tibial pulses are 2+ on the right side and 2+ on the left side.     Heart sounds: Normal heart sounds. No murmur heard.    No gallop.  Pulmonary:     Effort: Pulmonary effort is normal. No respiratory distress.     Breath sounds: Normal breath sounds. No stridor. No wheezing, rhonchi or rales.  Chest:       Comments: Hard bony prominence, no surrounding areas of erythema or edema, nontender to palpation.  Abdominal:     General: Abdomen is flat. Bowel sounds are normal.     Palpations: Abdomen is soft.     Tenderness: There is abdominal tenderness in the right upper quadrant and right lower quadrant. There is no right CVA tenderness, left CVA tenderness, guarding or rebound. Positive signs include Murphy's sign and McBurney's sign. Negative signs include Rovsing's sign and psoas sign.      Hernia: No hernia is present.       Comments: Tender to palpation RUQ and RLQ. No palpable mass/hernia   Musculoskeletal:     Cervical back: Neck supple. No edema, erythema, signs of trauma, rigidity, torticollis or crepitus. Pain with movement present. No spinous process tenderness or muscular tenderness. Normal range of motion.     Right lower leg: No edema.     Left lower leg: No edema.  Skin:    General: Skin is warm.     Capillary Refill: Capillary refill takes less than 2 seconds.  Neurological:     General: No focal deficit present.     Mental Status: She is alert, oriented to person, place, and time and easily aroused. Mental status is at baseline.     GCS: GCS eye subscore is 4. GCS verbal subscore is 5. GCS motor subscore is 6.     Motor: No weakness.  Psychiatric:        Attention and Perception: Attention and perception normal.        Mood and Affect: Affect normal. Mood is anxious.        Speech: Speech normal.        Behavior: Behavior normal. Behavior is cooperative.        Thought Content: Thought content normal. Thought content does not include homicidal or suicidal ideation. Thought content does not include homicidal or suicidal plan.        Cognition and Memory: Cognition and memory normal.        Judgment: Judgment normal.      Results for orders placed or performed in visit on 07/09/22  CMP14+EGFR  Result Value Ref Range   Glucose 94 70 - 99 mg/dL   BUN 7 6 - 24 mg/dL   Creatinine, Ser 0.98 0.57 - 1.00 mg/dL  eGFR 75 >59 mL/min/1.73   BUN/Creatinine Ratio 8 (L) 9 - 23   Sodium 139 134 - 144 mmol/L   Potassium 4.2 3.5 - 5.2 mmol/L   Chloride 103 96 - 106 mmol/L   CO2 20 20 - 29 mmol/L   Calcium 9.5 8.7 - 10.2 mg/dL   Total Protein 6.2 6.0 - 8.5 g/dL   Albumin 4.0 3.8 - 4.9 g/dL   Globulin, Total 2.2 1.5 - 4.5 g/dL   Bilirubin Total 0.2 0.0 - 1.2 mg/dL   Alkaline Phosphatase 88 44 - 121 IU/L   AST 11 0 - 40 IU/L   ALT 8 0 - 32 IU/L  CBC with  Differential/Platelet  Result Value Ref Range   WBC 9.4 3.4 - 10.8 x10E3/uL   RBC 4.71 3.77 - 5.28 x10E6/uL   Hemoglobin 13.4 11.1 - 15.9 g/dL   Hematocrit 16.1 09.6 - 46.6 %   MCV 87 79 - 97 fL   MCH 28.5 26.6 - 33.0 pg   MCHC 32.7 31.5 - 35.7 g/dL   RDW 04.5 40.9 - 81.1 %   Platelets 207 150 - 450 x10E3/uL   Neutrophils 61 Not Estab. %   Lymphs 30 Not Estab. %   Monocytes 4 Not Estab. %   Eos 4 Not Estab. %   Basos 1 Not Estab. %   Neutrophils Absolute 5.7 1.4 - 7.0 x10E3/uL   Lymphocytes Absolute 2.8 0.7 - 3.1 x10E3/uL   Monocytes Absolute 0.4 0.1 - 0.9 x10E3/uL   EOS (ABSOLUTE) 0.3 0.0 - 0.4 x10E3/uL   Basophils Absolute 0.1 0.0 - 0.2 x10E3/uL   Immature Granulocytes 0 Not Estab. %   Immature Grans (Abs) 0.0 0.0 - 0.1 x10E3/uL  Lipid panel  Result Value Ref Range   Cholesterol, Total 131 100 - 199 mg/dL   Triglycerides 69 0 - 149 mg/dL   HDL 40 >91 mg/dL   VLDL Cholesterol Cal 14 5 - 40 mg/dL   LDL Chol Calc (NIH) 77 0 - 99 mg/dL   Chol/HDL Ratio 3.3 0.0 - 4.4 ratio  TSH  Result Value Ref Range   TSH 0.645 0.450 - 4.500 uIU/mL       05/17/2022    1:59 PM 07/18/2021    2:04 PM 10/04/2020    2:59 PM 08/12/2020    3:27 PM 05/25/2019    9:22 AM  Depression screen PHQ 2/9  Decreased Interest 0 0 0 0 0  Down, Depressed, Hopeless 0 0 0 0 0  PHQ - 2 Score 0 0 0 0 0  Altered sleeping   0 0   Tired, decreased energy   0 0   Change in appetite   0 0   Feeling bad or failure about yourself    0 0   Trouble concentrating   0 0   Moving slowly or fidgety/restless   0 0   Suicidal thoughts   0 0   PHQ-9 Score   0 0   Difficult doing work/chores   Not difficult at all Not difficult at all   Patient refused PHQ/GAD7 screening today.   Pertinent labs & imaging results that were available during my care of the patient were reviewed by me and considered in my medical decision making.  Assessment & Plan:  Kenzi was seen today for throat chest stomach.  Diagnoses and all  orders for this visit:  Tenderness of neck Labs and imaging as below. Will communicate results to patient once available. Will await results to  determine next steps.  -     US Soft Tissue Head/Neck (NON-THYROID); Future -     CBC with Differential/Platelet -     CMP14+EGFR -     Lipid panel -     VITAMIN D 25 Hydroxy (Vit-D Deficiency, Fractures)  Right lower quadrant abdominal pain Imaging as below. Will communicate results to patient once available. Will await results to determine next steps.   -     US Abdomen Complete; Future  RUQ abdominal pain Imaging as below. Will communicate results to patient once available. Will await results to determine next steps.  -     Cancel: US Abdomen Limited RUQ (LIVER/GB); Future -     US Abdomen Complete; Future  Bony prominence Imaging as below. Will communicate results to patient once available. Will await results to determine next steps.  -     DG Chest 2 View; Future    Continue all other maintenance medications.  Follow up plan: No follow-ups on file.   Continue healthy lifestyle choices, including diet (rich in fruits, vegetables, and lean proteins, and low in salt and simple carbohydrates) and exercise (at least 30 minutes of moderate physical activity daily).  Written and verbal instructions provided   The above assessment and management plan was discussed with the patient. The patient verbalized understanding of and has agreed to the management plan. Patient is aware to call the clinic if they develop any new symptoms or if symptoms persist or worsen. Patient is aware when to return to the clinic for a follow-up visit. Patient educated on when it is appropriate to go to the emergency department.   Neale Burly, DNP-FNP [facility] [address] [place], [place] [address] [phone]

## 2022-11-29 LAB — CBC WITH DIFFERENTIAL/PLATELET
Basophils Absolute: 0.1 10*3/uL (ref 0.0–0.2)
Basos: 1 %
EOS (ABSOLUTE): 0.2 10*3/uL (ref 0.0–0.4)
Eos: 2 %
Hematocrit: 46.1 % (ref 34.0–46.6)
Hemoglobin: 14.4 g/dL (ref 11.1–15.9)
Immature Grans (Abs): 0 10*3/uL (ref 0.0–0.1)
Immature Granulocytes: 0 %
Lymphocytes Absolute: 3.3 10*3/uL — ABNORMAL HIGH (ref 0.7–3.1)
Lymphs: 29 %
MCH: 27.8 pg (ref 26.6–33.0)
MCHC: 31.2 g/dL — ABNORMAL LOW (ref 31.5–35.7)
MCV: 89 fL (ref 79–97)
Monocytes Absolute: 0.4 10*3/uL (ref 0.1–0.9)
Monocytes: 3 %
Neutrophils Absolute: 7.3 10*3/uL — ABNORMAL HIGH (ref 1.4–7.0)
Neutrophils: 65 %
Platelets: 255 10*3/uL (ref 150–450)
RBC: 5.18 x10E6/uL (ref 3.77–5.28)
RDW: 13.1 % (ref 11.7–15.4)
WBC: 11.4 10*3/uL — ABNORMAL HIGH (ref 3.4–10.8)

## 2022-11-29 LAB — CMP14+EGFR
ALT: 18 [IU]/L (ref 0–32)
AST: 16 [IU]/L (ref 0–40)
Albumin: 4.7 g/dL (ref 3.8–4.9)
Alkaline Phosphatase: 91 [IU]/L (ref 44–121)
BUN/Creatinine Ratio: 11 (ref 9–23)
BUN: 11 mg/dL (ref 6–24)
Bilirubin Total: 0.3 mg/dL (ref 0.0–1.2)
CO2: 23 mmol/L (ref 20–29)
Calcium: 10.3 mg/dL — ABNORMAL HIGH (ref 8.7–10.2)
Chloride: 104 mmol/L (ref 96–106)
Creatinine, Ser: 0.99 mg/dL (ref 0.57–1.00)
Globulin, Total: 2.2 g/dL (ref 1.5–4.5)
Glucose: 90 mg/dL (ref 70–99)
Potassium: 4.1 mmol/L (ref 3.5–5.2)
Sodium: 144 mmol/L (ref 134–144)
Total Protein: 6.9 g/dL (ref 6.0–8.5)
eGFR: 69 mL/min/{1.73_m2} (ref >59)

## 2022-11-29 LAB — LIPID PANEL
Chol/HDL Ratio: 3 {ratio} (ref 0.0–4.4)
Cholesterol, Total: 132 mg/dL (ref 100–199)
HDL: 44 mg/dL (ref >39)
LDL Chol Calc (NIH): 72 mg/dL (ref 0–99)
Triglycerides: 84 mg/dL (ref 0–149)
VLDL Cholesterol Cal: 16 mg/dL (ref 5–40)

## 2022-11-29 LAB — VITAMIN D 25 HYDROXY (VIT D DEFICIENCY, FRACTURES): Vit D, 25-Hydroxy: 66.4 ng/mL (ref 30.0–100.0)

## 2022-12-04 NOTE — Progress Notes (Signed)
Slightly elevated WBC, neutrophils, and lymphocytes, may be related to infectious process. Slightly decreased MCHC and slightly elevated calcium, not concerning at this time. All other labs normal. Please complete imaging.

## 2022-12-13 ENCOUNTER — Ambulatory Visit (HOSPITAL_COMMUNITY)
Admission: RE | Admit: 2022-12-13 | Discharge: 2022-12-13 | Disposition: A | Discharge status: Home / Self Care | Payer: 59 | Source: Ambulatory Visit | Attending: Family Medicine | Admitting: Family Medicine

## 2022-12-13 DIAGNOSIS — R1011 Right upper quadrant pain: Secondary | ICD-10-CM | POA: Insufficient documentation

## 2022-12-13 DIAGNOSIS — R1031 Right lower quadrant pain: Secondary | ICD-10-CM | POA: Insufficient documentation

## 2022-12-13 DIAGNOSIS — M542 Cervicalgia: Secondary | ICD-10-CM | POA: Insufficient documentation

## 2022-12-13 DIAGNOSIS — R471 Dysarthria and anarthria: Secondary | ICD-10-CM | POA: Diagnosis not present

## 2022-12-13 DIAGNOSIS — G8929 Other chronic pain: Secondary | ICD-10-CM | POA: Diagnosis not present

## 2022-12-13 DIAGNOSIS — R109 Unspecified abdominal pain: Secondary | ICD-10-CM | POA: Diagnosis not present

## 2022-12-14 NOTE — Progress Notes (Signed)
Negative for abnormalities. If pain continues, please follow up for further evaluation.

## 2022-12-14 NOTE — Progress Notes (Signed)
Possible stone seen. There are two options: can send in flomax that patient can take for a few weeks and she should pass the stone since it is small enough or I can order a CT scan to make sure it is a stone. She just may have cost with the CT scan.

## 2022-12-17 MED ORDER — TAMSULOSIN HCL 0.4 MG PO CAPS: 0.4000 mg | ORAL_CAPSULE | Freq: Every day | ORAL | 0 refills | Status: DC

## 2022-12-17 NOTE — Progress Notes (Signed)
Flomax sent in to [facility]

## 2022-12-17 NOTE — Addendum Note (Signed)
Addended by: Neale Burly on: 12/17/2022 08:24 PM   Modules accepted: Orders

## 2023-01-01 ENCOUNTER — Other Ambulatory Visit: Payer: Self-pay | Admitting: Family

## 2023-01-08 ENCOUNTER — Telehealth: Payer: 59 | Admitting: Family

## 2023-01-08 ENCOUNTER — Encounter: Payer: Self-pay | Admitting: Family

## 2023-01-08 VITALS — BP 120/74 | Wt 138.0 lb

## 2023-01-08 DIAGNOSIS — F411 Generalized anxiety disorder: Secondary | ICD-10-CM | POA: Diagnosis not present

## 2023-01-08 DIAGNOSIS — D329 Benign neoplasm of meninges, unspecified: Secondary | ICD-10-CM

## 2023-01-08 DIAGNOSIS — I6990 Unspecified sequelae of unspecified cerebrovascular disease: Secondary | ICD-10-CM | POA: Diagnosis not present

## 2023-01-08 DIAGNOSIS — E782 Mixed hyperlipidemia: Secondary | ICD-10-CM | POA: Diagnosis not present

## 2023-01-08 MED ORDER — ATORVASTATIN CALCIUM 40 MG PO TABS: 40.0000 mg | ORAL_TABLET | Freq: Every day | ORAL | 0 refills | Status: DC

## 2023-01-08 MED ORDER — CITALOPRAM HYDROBROMIDE 40 MG PO TABS: 40.0000 mg | ORAL_TABLET | Freq: Every day | ORAL | 0 refills | Status: DC

## 2023-01-08 MED ORDER — BUSPIRONE HCL 5 MG PO TABS: 5.0000 mg | ORAL_TABLET | Freq: Three times a day (TID) | ORAL | 1 refills | Status: DC

## 2023-01-08 NOTE — Patient Instructions (Signed)

## 2023-01-08 NOTE — Progress Notes (Signed)
 Virtual Visit Consent   THU BAGGETT, you are scheduled for a virtual visit with a [facility] provider today. Just as with appointments in the office, your consent must be obtained to participate. Your consent will be active for this visit and any virtual visit you may have with one of our providers in the next 365 days. If you have a MyChart account, a copy of this consent can be sent to you electronically.  As this is a virtual visit, video technology does not allow for your provider to perform a traditional examination. This may limit your provider's ability to fully assess your condition. If your provider identifies any concerns that need to be evaluated in person or the need to arrange testing (such as labs, EKG, etc.), we will make arrangements to do so. Although advances in technology are sophisticated, we cannot ensure that it will always work on either your end or our end. If the connection with a video visit is poor, the visit may have to be switched to a telephone visit. With either a video or telephone visit, we are not always able to ensure that we have a secure connection.  By engaging in this virtual visit, you consent to the provision of healthcare and authorize for your insurance to be billed (if applicable) for the services provided during this visit. Depending on your insurance coverage, you may receive a charge related to this service.  I need to obtain your verbal consent now. Are you willing to proceed with your visit today? ELOYCE BULTMAN has provided verbal consent on 01/08/2023 for a virtual visit (video or telephone). Bari Learn, FNP  Date: 01/08/2023 3:33 PM  Virtual Visit via Video Note   I, Bari Learn, connected with  Sheena Willis  ([id number], 25-Aug-1970) on 01/08/23 at  3:10 PM EST by a video-enabled telemedicine application and verified that I am speaking with the correct person using two identifiers.  Location: Patient: Virtual Visit Location Patient:  Home Provider: Virtual Visit Location Provider: Home Office   I discussed the limitations of evaluation and management by telemedicine and the availability of in person appointments. The patient expressed understanding and agreed to proceed.    History of Present Illness: Sheena Willis is a 53 y.o. who identifies as a female who was assigned female at birth, and is being seen today for chronic follow up. She is followed by Neurologists annually for Hx CVA with right sided weakness and hx meningioma.     HPI: Hyperlipidemia This is a chronic problem. The current episode started more than 1 year ago. The problem is controlled. Recent lipid tests were reviewed and are normal. Current antihyperlipidemic treatment includes statins. The current treatment provides moderate improvement of lipids. Risk factors for coronary artery disease include dyslipidemia, hypertension and a sedentary lifestyle.  Anxiety Presents for follow-up visit. Symptoms include excessive worry and nervous/anxious behavior. Symptoms occur occasionally. The severity of symptoms is mild.      Problems:  Patient Active Problem List   Diagnosis Date Noted   Hyperlipidemia 07/09/2022   Marijuana use 05/28/2019   Controlled substance agreement broken 05/25/2019   Late effects of CVA (cerebrovascular accident) 04/30/2019   GAD (generalized anxiety disorder) 04/30/2019   Meningioma (HCC) 08/06/2011    Allergies:  Allergies  Allergen Reactions   Fish Allergy Rash    Scallops only    Medications:  Current Outpatient Medications:    aspirin EC 81 MG tablet, Take by mouth., Disp: , Rfl:  atorvastatin  (LIPITOR) 40 MG tablet, Take 1 tablet (40 mg total) by mouth daily., Disp: 90 tablet, Rfl: 0   busPIRone  (BUSPAR ) 5 MG tablet, Take 1 tablet (5 mg total) by mouth 3 (three) times daily., Disp: 270 tablet, Rfl: 1   cholecalciferol (VITAMIN D3) 25 MCG (1000 UNIT) tablet, Take 1,000 Units by mouth daily., Disp: , Rfl:     citalopram  (CELEXA ) 40 MG tablet, Take 1 tablet (40 mg total) by mouth daily., Disp: 90 tablet, Rfl: 0   Multiple Vitamins-Minerals (MULTIVITAMINS THER. W/MINERALS) TABS, Take 1 tablet by mouth daily., Disp: , Rfl:    tamsulosin  (FLOMAX ) 0.4 MG CAPS capsule, Take 1 capsule (0.4 mg total) by mouth daily., Disp: 30 capsule, Rfl: 0   vitamin C (ASCORBIC ACID) 500 MG tablet, Take 500 mg by mouth daily., Disp: , Rfl:   Observations/Objective: Patient is well-developed, well-nourished in no acute distress.  Resting comfortably  at home.  Head is normocephalic, atraumatic.  No labored breathing.  Speech is clear and coherent with logical content.  Patient is alert and oriented at baseline.    Assessment and Plan: 1. Meningioma (HCC) (Primary)  2. Late effects of CVA (cerebrovascular accident) - atorvastatin  (LIPITOR) 40 MG tablet; Take 1 tablet (40 mg total) by mouth daily.  Dispense: 90 tablet; Refill: 0  3. GAD (generalized anxiety disorder) - busPIRone  (BUSPAR ) 5 MG tablet; Take 1 tablet (5 mg total) by mouth 3 (three) times daily.  Dispense: 270 tablet; Refill: 1 - citalopram  (CELEXA ) 40 MG tablet; Take 1 tablet (40 mg total) by mouth daily.  Dispense: 90 tablet; Refill: 0  4. Moderate mixed hyperlipidemia not requiring statin therapy - atorvastatin  (LIPITOR) 40 MG tablet; Take 1 tablet (40 mg total) by mouth daily.  Dispense: 90 tablet; Refill: 0  Continue current medications  Keep follow up with Neurologists  Force fluids Encouraged healthy diet and exercise Follow up in 6 months   Follow Up Instructions: I discussed the assessment and treatment plan with the patient. The patient was provided an opportunity to ask questions and all were answered. The patient agreed with the plan and demonstrated an understanding of the instructions.  A copy of instructions were sent to the patient via MyChart unless otherwise noted below.     The patient was advised to call back or seek an  in-person evaluation if the symptoms worsen or if the condition fails to improve as anticipated.    Bari Learn, FNP

## 2023-01-10 ENCOUNTER — Other Ambulatory Visit: Payer: Self-pay | Admitting: Family Medicine

## 2023-05-03 ENCOUNTER — Other Ambulatory Visit (HOSPITAL_COMMUNITY): Payer: Self-pay | Admitting: Family

## 2023-05-03 DIAGNOSIS — Z1231 Encounter for screening mammogram for malignant neoplasm of breast: Secondary | ICD-10-CM

## 2023-05-24 ENCOUNTER — Other Ambulatory Visit: Payer: Self-pay | Admitting: Family

## 2023-05-24 DIAGNOSIS — F411 Generalized anxiety disorder: Secondary | ICD-10-CM

## 2023-06-23 ENCOUNTER — Other Ambulatory Visit: Payer: Self-pay | Admitting: Family

## 2023-06-23 DIAGNOSIS — E782 Mixed hyperlipidemia: Secondary | ICD-10-CM

## 2023-06-23 DIAGNOSIS — I6990 Unspecified sequelae of unspecified cerebrovascular disease: Secondary | ICD-10-CM

## 2023-07-16 ENCOUNTER — Ambulatory Visit (INDEPENDENT_AMBULATORY_CARE_PROVIDER_SITE_OTHER)

## 2023-07-16 ENCOUNTER — Ambulatory Visit: Admitting: Family

## 2023-07-16 ENCOUNTER — Encounter: Payer: Self-pay | Admitting: Family

## 2023-07-16 VITALS — BP 115/73 | HR 60 | Temp 97.6°F | Ht 66.0 in | Wt 127.8 lb

## 2023-07-16 DIAGNOSIS — R1310 Dysphagia, unspecified: Secondary | ICD-10-CM

## 2023-07-16 DIAGNOSIS — R140 Abdominal distension (gaseous): Secondary | ICD-10-CM

## 2023-07-16 DIAGNOSIS — Z0001 Encounter for general adult medical examination with abnormal findings: Secondary | ICD-10-CM | POA: Diagnosis not present

## 2023-07-16 DIAGNOSIS — Z0000 Encounter for general adult medical examination without abnormal findings: Secondary | ICD-10-CM

## 2023-07-16 DIAGNOSIS — R1011 Right upper quadrant pain: Secondary | ICD-10-CM

## 2023-07-16 DIAGNOSIS — E782 Mixed hyperlipidemia: Secondary | ICD-10-CM

## 2023-07-16 DIAGNOSIS — D329 Benign neoplasm of meninges, unspecified: Secondary | ICD-10-CM | POA: Diagnosis not present

## 2023-07-16 DIAGNOSIS — I6990 Unspecified sequelae of unspecified cerebrovascular disease: Secondary | ICD-10-CM

## 2023-07-16 DIAGNOSIS — R109 Unspecified abdominal pain: Secondary | ICD-10-CM | POA: Diagnosis not present

## 2023-07-16 DIAGNOSIS — F411 Generalized anxiety disorder: Secondary | ICD-10-CM

## 2023-07-16 MED ORDER — CITALOPRAM HYDROBROMIDE 40 MG PO TABS: 40.0000 mg | ORAL_TABLET | Freq: Every day | ORAL | 1 refills | Status: DC

## 2023-07-16 MED ORDER — BUSPIRONE HCL 5 MG PO TABS: 5.0000 mg | ORAL_TABLET | Freq: Three times a day (TID) | ORAL | 1 refills | Status: DC

## 2023-07-16 MED ORDER — ATORVASTATIN CALCIUM 40 MG PO TABS: 40.0000 mg | ORAL_TABLET | Freq: Every day | ORAL | 1 refills | Status: DC

## 2023-07-16 NOTE — Progress Notes (Signed)
 Subjective:    Patient ID: Sheena Willis, female    DOB: [birth year], 53 y.o.   MRN: [id number]  Chief Complaint  Patient presents with   Follow-up    DISCUSS U/S RESULTS.   Pt presents to the office today for CPE without pap.   She is followed by Neurologists annually for Hx CVA with right sided weakness and hx meningioma.     She is complaining of abdominal bloating and pain that has been on going for 6+. Also, complaining of dysphagia.  Anxiety Presents for follow-up visit. Symptoms include excessive worry, irritability, nervous/anxious behavior and restlessness. Symptoms occur occasionally. The severity of symptoms is moderate.    Hyperlipidemia This is a chronic problem. The current episode started more than 1 year ago. The problem is controlled. Recent lipid tests were reviewed and are normal. Current antihyperlipidemic treatment includes statins. The current treatment provides moderate improvement of lipids. Risk factors for coronary artery disease include dyslipidemia, hypertension and a sedentary lifestyle.      Review of Systems  Constitutional:  Positive for irritability.  Psychiatric/Behavioral:  The patient is nervous/anxious.   All other systems reviewed and are negative.  Family History  Problem Relation Age of Onset   Cancer Maternal Aunt    Diabetes Maternal Aunt    Cancer Maternal Aunt    Social History   Socioeconomic History   Marital status: Divorced    Spouse name: Not on file   Number of children: 2   Years of education: 12   Highest education level: High school graduate  Occupational History   Occupation: disability  Tobacco Use   Smoking status: Former    Current packs/day: 0.00    Types: Cigarettes    Quit date: 01/24/2018    Years since quitting: 5.4   Smokeless tobacco: Never  Vaping Use   Vaping status: Never Used  Substance and Sexual Activity   Alcohol use: No   Drug use: No   Sexual activity: Not Currently  Other Topics  Concern   Not on file  Social History Narrative   Not on file   Social Drivers of Health   Financial Resource Strain: Low Risk  (05/17/2022)   Overall Financial Resource Strain (CARDIA)    Difficulty of Paying Living Expenses: Not hard at all  Food Insecurity: No Food Insecurity (05/17/2022)   Hunger Vital Sign    Worried About Running Out of Food in the Last Year: Never true    Ran Out of Food in the Last Year: Never true  Transportation Needs: No Transportation Needs (05/17/2022)   PRAPARE - Administrator, Civil Service (Medical): No    Lack of Transportation (Non-Medical): No  Physical Activity: Insufficiently Active (05/17/2022)   Exercise Vital Sign    Days of Exercise per Week: 3 days    Minutes of Exercise per Session: 30 min  Stress: No Stress Concern Present (05/17/2022)   [employer] of Occupational Health - Occupational Stress Questionnaire    Feeling of Stress : Not at all  Social Connections: Moderately Isolated (05/17/2022)   Social Connection and Isolation Panel    Frequency of Communication with Friends and Family: More than three times a week    Frequency of Social Gatherings with Friends and Family: More than three times a week    Attends Religious Services: 1 to 4 times per year    Active Member of [employer] or Organizations: No    Attends Banker Meetings: Never  Marital Status: Divorced       Objective:   Physical Exam Vitals reviewed.  Constitutional:      General: She is not in acute distress.    Appearance: She is well-developed.  HENT:     Head: Normocephalic and atraumatic.     Right Ear: Tympanic membrane normal.     Left Ear: Tympanic membrane normal.  Eyes:     Pupils: Pupils are equal, round, and reactive to light.  Neck:     Thyroid : No thyromegaly.  Cardiovascular:     Rate and Rhythm: Normal rate and regular rhythm.     Heart sounds: Normal heart sounds. No murmur heard. Pulmonary:     Effort: Pulmonary  effort is normal. No respiratory distress.     Breath sounds: Normal breath sounds. No wheezing.  Abdominal:     General: Bowel sounds are normal. There is no distension.     Palpations: Abdomen is soft.     Tenderness: There is abdominal tenderness (mild RUQ pain).  Musculoskeletal:        General: No tenderness. Normal range of motion.     Cervical back: Normal range of motion and neck supple.  Skin:    General: Skin is warm and dry.  Neurological:     Mental Status: She is alert and oriented to person, place, and time.     Cranial Nerves: No cranial nerve deficit.     Deep Tendon Reflexes: Reflexes are normal and symmetric.  Psychiatric:        Behavior: Behavior normal.        Thought Content: Thought content normal.        Judgment: Judgment normal.        BP 115/73   Pulse 60   Temp 97.6 F (36.4 C) (Temporal)   Ht 5' 6 (1.676 m)   Wt 127 lb 12.8 oz (58 kg)   SpO2 95%   BMI 20.63 kg/m   Assessment & Plan:   Sheena Willis comes in today with chief complaint of Follow-up (DISCUSS U/S RESULTS.)   Diagnosis and orders addressed: 1. Annual physical exam (Primary) - CMP14+EGFR - Lipid panel - CBC with Differential/Platelet - TSH - DG Abd 1 View; Future  2. Meningioma (HCC) - CMP14+EGFR - CBC with Differential/Platelet  3. Late effects of CVA (cerebrovascular accident) - CMP14+EGFR - Lipid panel - CBC with Differential/Platelet - atorvastatin  (LIPITOR) 40 MG tablet; Take 1 tablet (40 mg total) by mouth daily.  Dispense: 90 tablet; Refill: 1  4. Moderate mixed hyperlipidemia not requiring statin therapy - CMP14+EGFR - CBC with Differential/Platelet - atorvastatin  (LIPITOR) 40 MG tablet; Take 1 tablet (40 mg total) by mouth daily.  Dispense: 90 tablet; Refill: 1  5. GAD (generalized anxiety disorder) - CMP14+EGFR - CBC with Differential/Platelet - citalopram  (CELEXA ) 40 MG tablet; Take 1 tablet (40 mg total) by mouth daily.  Dispense: 90 tablet;  Refill: 1 - busPIRone  (BUSPAR ) 5 MG tablet; Take 1 tablet (5 mg total) by mouth 3 (three) times daily.  Dispense: 270 tablet; Refill: 1  6. Abdominal bloating - Ambulatory referral to Gastroenterology - CMP14+EGFR - CBC with Differential/Platelet - DG Abd 1 View; Future  7. Right upper quadrant abdominal pain - Ambulatory referral to Gastroenterology - CMP14+EGFR - CBC with Differential/Platelet - DG Abd 1 View; Future  8. Dysphagia, unspecified type - Ambulatory referral to Gastroenterology - CMP14+EGFR - CBC with Differential/Platelet   Labs pending Referral to GI pending  KUB pending  Continue  current medications  Health Maintenance reviewed Diet and exercise encouraged  Follow up plan: 6 months    Bari Learn, FNP

## 2023-07-16 NOTE — Patient Instructions (Signed)
 Abdominal Pain, Adult  Pain in the abdomen (abdominal pain) can be caused by many things. In most cases, it gets better with no treatment or by being treated at home. But in some cases, it can be serious. Your health care provider will ask questions about your medical history and do a physical exam to try to figure out what is causing your pain. Follow these instructions at home: Medicines Take over-the-counter and prescription medicines only as told by your provider. Do not take medicines that help you poop (laxatives) unless told by your provider. General instructions Watch your condition for any changes. Drink enough fluid to keep your pee (urine) pale yellow. Contact a health care provider if: Your pain changes, gets worse, or lasts longer than expected. You have severe cramping or bloating in your abdomen, or you vomit. Your pain gets worse with meals, after eating, or with certain foods. You are constipated or have diarrhea for more than 2-3 days. You are not hungry, or you lose weight without trying. You have signs of dehydration. These may include: Dark pee, very little pee, or no pee. Cracked lips or dry mouth. Sleepiness or weakness. You have pain when you pee (urinate) or poop. Your abdominal pain wakes you up at night. You have blood in your pee. You have a fever. Get help right away if: You cannot stop vomiting. Your pain is only in one part of the abdomen. Pain on the right side could be caused by appendicitis. You have bloody or black poop (stool), or poop that looks like tar. You have trouble breathing. You have chest pain. These symptoms may be an emergency. Get help right away. Call 911. Do not wait to see if the symptoms will go away. Do not drive yourself to the hospital. This information is not intended to replace advice given to you by your health care provider. Make sure you discuss any questions you have with your health care provider. Document Revised:  10/04/2021 Document Reviewed: 10/04/2021 Elsevier Patient Education  2024 [employer].

## 2023-07-17 LAB — CMP14+EGFR
ALT: 20 IU/L (ref 0–32)
AST: 19 IU/L (ref 0–40)
Albumin: 4.5 g/dL (ref 3.8–4.9)
Alkaline Phosphatase: 112 IU/L (ref 44–121)
BUN/Creatinine Ratio: 8 — ABNORMAL LOW (ref 9–23)
BUN: 7 mg/dL (ref 6–24)
Bilirubin Total: 0.4 mg/dL (ref 0.0–1.2)
CO2: 23 mmol/L (ref 20–29)
Calcium: 10.4 mg/dL — ABNORMAL HIGH (ref 8.7–10.2)
Chloride: 102 mmol/L (ref 96–106)
Creatinine, Ser: 0.92 mg/dL (ref 0.57–1.00)
Globulin, Total: 2.2 g/dL (ref 1.5–4.5)
Glucose: 63 mg/dL — ABNORMAL LOW (ref 70–99)
Potassium: 3.9 mmol/L (ref 3.5–5.2)
Sodium: 142 mmol/L (ref 134–144)
Total Protein: 6.7 g/dL (ref 6.0–8.5)
eGFR: 75 mL/min/1.73 (ref >59)

## 2023-07-17 LAB — LIPID PANEL
Chol/HDL Ratio: 2.3 ratio (ref 0.0–4.4)
Cholesterol, Total: 114 mg/dL (ref 100–199)
HDL: 49 mg/dL (ref >39)
LDL Chol Calc (NIH): 48 mg/dL (ref 0–99)
Triglycerides: 84 mg/dL (ref 0–149)
VLDL Cholesterol Cal: 17 mg/dL (ref 5–40)

## 2023-07-17 LAB — CBC WITH DIFFERENTIAL/PLATELET
Basophils Absolute: 0.1 x10E3/uL (ref 0.0–0.2)
Basos: 1 %
EOS (ABSOLUTE): 0.8 x10E3/uL — ABNORMAL HIGH (ref 0.0–0.4)
Eos: 10 %
Hematocrit: 45.6 % (ref 34.0–46.6)
Hemoglobin: 14.5 g/dL (ref 11.1–15.9)
Immature Grans (Abs): 0 x10E3/uL (ref 0.0–0.1)
Immature Granulocytes: 0 %
Lymphocytes Absolute: 3 x10E3/uL (ref 0.7–3.1)
Lymphs: 35 %
MCH: 28 pg (ref 26.6–33.0)
MCHC: 31.8 g/dL (ref 31.5–35.7)
MCV: 88 fL (ref 79–97)
Monocytes Absolute: 0.5 x10E3/uL (ref 0.1–0.9)
Monocytes: 5 %
Neutrophils Absolute: 4.1 x10E3/uL (ref 1.4–7.0)
Neutrophils: 49 %
Platelets: 326 x10E3/uL (ref 150–450)
RBC: 5.17 x10E6/uL (ref 3.77–5.28)
RDW: 13.1 % (ref 11.7–15.4)
WBC: 8.5 x10E3/uL (ref 3.4–10.8)

## 2023-07-17 LAB — TSH: TSH: 0.729 u[IU]/mL (ref 0.450–4.500)

## 2023-07-18 ENCOUNTER — Ambulatory Visit: Payer: Self-pay | Admitting: Family

## 2023-07-19 ENCOUNTER — Ambulatory Visit (HOSPITAL_COMMUNITY)
Admission: RE | Admit: 2023-07-19 | Discharge: 2023-07-19 | Disposition: A | Discharge status: Home / Self Care | Source: Ambulatory Visit | Attending: Family | Admitting: Family

## 2023-07-19 DIAGNOSIS — Z1231 Encounter for screening mammogram for malignant neoplasm of breast: Secondary | ICD-10-CM | POA: Diagnosis not present

## 2023-07-25 ENCOUNTER — Ambulatory Visit

## 2023-07-25 VITALS — BP 115/73 | HR 79 | Ht 66.0 in | Wt 127.0 lb

## 2023-07-25 DIAGNOSIS — Z0000 Encounter for general adult medical examination without abnormal findings: Secondary | ICD-10-CM | POA: Diagnosis not present

## 2023-07-25 NOTE — Patient Instructions (Signed)
 Sheena Willis , Thank you for taking time out of your busy schedule to complete your Annual Wellness Visit with me. I enjoyed our conversation and look forward to speaking with you again next year. I, as well as your care team,  appreciate your ongoing commitment to your health goals. Please review the following plan we discussed and let me know if I can assist you in the future. Your Game plan/ To Do List    Follow up Visits: Next Medicare AWV with our clinical staff: 07/27/24 at 3:10p.m.    Next Office Visit with your provider: 01/16/24 at 3:25a.m.  Clinician Recommendations:  Aim for 30 minutes of exercise or brisk walking, 6-8 glasses of water, and 5 servings of fruits and vegetables each day.       This is a list of the screening recommended for you and due dates:  Health Maintenance  Topic Date Due   Hepatitis B Vaccine (1 of 3 - 19+ 3-dose series) Never done   Medicare Annual Wellness Visit  05/17/2023   COVID-19 Vaccine (5 - 2024-25 season) 08/09/2024*   Flu Shot  08/02/2023   Cologuard (Stool DNA test)  09/03/2023   Pap with HPV screening  05/24/2024   Mammogram  07/18/2024   DTaP/Tdap/Td vaccine (5 - Td or Tdap) 09/15/2028   Hepatitis C Screening  Completed   HIV Screening  Completed   Zoster (Shingles) Vaccine  Completed   HPV Vaccine  Aged Out   Meningitis B Vaccine  Aged Out  *Topic was postponed. The date shown is not the original due date.    Advanced directives: (Declined) Advance directive discussed with you today. Even though you declined this today, please call our office should you change your mind, and we can give you the proper paperwork for you to fill out. Advance Care Planning is important because it:  [x]  Makes sure you receive the medical care that is consistent with your values, goals, and preferences  [x]  It provides guidance to your family and loved ones and reduces their decisional burden about whether or not they are making the right decisions based on  your wishes.  Follow the link provided in your after visit summary or read over the paperwork we have mailed to you to help you started getting your Advance Directives in place. If you need assistance in completing these, please reach out to us  so that we can help you!  See attachments for Preventive Care and Fall Prevention Tips.

## 2023-07-25 NOTE — Progress Notes (Signed)
 Subjective:   Sheena Willis is a 53 y.o. who presents for a Medicare Wellness preventive visit.  As a reminder, Annual Wellness Visits don't include a physical exam, and some assessments may be limited, especially if this visit is performed virtually. We may recommend an in-person follow-up visit with your provider if needed.  Visit Complete: Virtual I connected with  Sheena Willis on 07/25/23 by a audio enabled telemedicine application and verified that I am speaking with the correct person using two identifiers.  Patient Location: Home  Provider Location: Home Office  I discussed the limitations of evaluation and management by telemedicine. The patient expressed understanding and agreed to proceed.  Vital Signs: Because this visit was a virtual/telehealth visit, some criteria may be missing or patient reported. Any vitals not documented were not able to be obtained and vitals that have been documented are patient reported.  VideoDeclined- This patient declined Librarian, academic. Therefore the visit was completed with audio only.  Persons Participating in Visit: Patient.  AWV Questionnaire: No: Patient Medicare AWV questionnaire was not completed prior to this visit.  Cardiac Risk Factors include: advanced age (>62men, >84 women);dyslipidemia     Objective:    Today's Vitals   07/25/23 1508  BP: 115/73  Pulse: 79  Weight: 127 lb (57.6 kg)  Height: 5' 6 (1.676 m)   Body mass index is 20.5 kg/m.     07/25/2023    3:12 PM 05/17/2022    2:00 PM 10/04/2020    3:09 PM 09/15/2018    1:23 PM 01/09/2018    3:34 PM 07/16/2012    2:04 PM  Advanced Directives  Does Patient Have a Medical Advance Directive? No No No No No  Patient does not have advance directive;Patient would not like information   Would patient like information on creating a medical advance directive?  No - Patient declined No - Patient declined No - Patient declined No - Patient  declined    Pre-existing out of facility DNR order (yellow form or pink MOST form)      No      Data saved with a previous flowsheet row definition    Current Medications (verified) Outpatient Encounter Medications as of 07/25/2023  Medication Sig   aspirin EC 81 MG tablet Take by mouth.   atorvastatin  (LIPITOR) 40 MG tablet Take 1 tablet (40 mg total) by mouth daily.   busPIRone  (BUSPAR ) 5 MG tablet Take 1 tablet (5 mg total) by mouth 3 (three) times daily.   cholecalciferol (VITAMIN D3) 25 MCG (1000 UNIT) tablet Take 1,000 Units by mouth daily.   citalopram  (CELEXA ) 40 MG tablet Take 1 tablet (40 mg total) by mouth daily.   Multiple Vitamins-Minerals (MULTIVITAMINS THER. W/MINERALS) TABS Take 1 tablet by mouth daily.   vitamin C (ASCORBIC ACID) 500 MG tablet Take 500 mg by mouth daily.   No facility-administered encounter medications on file as of 07/25/2023.    Allergies (verified) Fish allergy   History: Past Medical History:  Diagnosis Date   Brain tumor (benign) (HCC)    Radiation    for brain tumors   Vertigo    due to benign brain tumor   Past Surgical History:  Procedure Laterality Date   BRAIN BIOPSY  12/05/2009   HALO APPLICATION     done before gamma knife radio surgery   LESION REMOVAL Left 07/23/2012   Procedure: EXCISION SKIN NEOPLASM LEFT ELBOW;  Surgeon: Oneil DELENA Budge, MD;  Location: [facility];  Service: General;  Laterality: Left;   MYRINGOTOMY WITH TUBE PLACEMENT Left    Dr. Karis   Family History  Problem Relation Age of Onset   Cancer Maternal Aunt    Diabetes Maternal Aunt    Cancer Maternal Aunt    Social History   Socioeconomic History   Marital status: Divorced    Spouse name: Not on file   Number of children: 2   Years of education: 12   Highest education level: High school graduate  Occupational History   Occupation: disability  Tobacco Use   Smoking status: Former    Current packs/day: 0.00    Types: Cigarettes    Quit date: 01/24/2018     Years since quitting: 5.5   Smokeless tobacco: Never  Vaping Use   Vaping status: Never Used  Substance and Sexual Activity   Alcohol use: No   Drug use: No   Sexual activity: Not Currently  Other Topics Concern   Not on file  Social History Narrative   Not on file   Social Drivers of Health   Financial Resource Strain: Low Risk  (07/25/2023)   Overall Financial Resource Strain (CARDIA)    Difficulty of Paying Living Expenses: Not hard at all  Food Insecurity: No Food Insecurity (07/25/2023)   Hunger Vital Sign    Worried About Running Out of Food in the Last Year: Never true    Ran Out of Food in the Last Year: Never true  Transportation Needs: No Transportation Needs (07/25/2023)   PRAPARE - Administrator, Civil Service (Medical): No    Lack of Transportation (Non-Medical): No  Physical Activity: Inactive (07/25/2023)   Exercise Vital Sign    Days of Exercise per Week: 0 days    Minutes of Exercise per Session: 0 min  Stress: No Stress Concern Present (07/25/2023)   [employer] of Occupational Health - Occupational Stress Questionnaire    Feeling of Stress: Not at all  Social Connections: Moderately Isolated (07/25/2023)   Social Connection and Isolation Panel    Frequency of Communication with Friends and Family: More than three times a week    Frequency of Social Gatherings with Friends and Family: More than three times a week    Attends Religious Services: More than 4 times per year    Active Member of [employer] or Organizations: No    Attends Engineer, structural: Never    Marital Status: Divorced    Tobacco Counseling Counseling given: Yes    Clinical Intake:  Pre-visit preparation completed: Yes  Pain : No/denies pain     BMI - recorded: 20.5 Nutritional Status: BMI of 19-24  Normal Nutritional Risks: None Diabetes: No  No results found for: HGBA1C   How often do you need to have someone help you when you read instructions,  pamphlets, or other written materials from your doctor or pharmacy?: 1 - Never  Interpreter Needed?: No  Information entered by :: alia t/cma   Activities of Daily Living     07/25/2023    3:11 PM  In your present state of health, do you have any difficulty performing the following activities:  Hearing? 0  Vision? 0  Difficulty concentrating or making decisions? 0  Walking or climbing stairs? 0  Dressing or bathing? 0  Doing errands, shopping? 0  Preparing Food and eating ? N  Using the Toilet? N  In the past six months, have you accidently leaked urine? N  Do  you have problems with loss of bowel control? N  Managing your Medications? N  Managing your Finances? N  Housekeeping or managing your Housekeeping? N    Patient Care Team: Lavell Bari LABOR, FNP as PCP - General (Family Medicine) Karis Clunes, MD as Consulting Physician (Otolaryngology) Candyce Ozell BIRCH, MD as Referring Physician (Radiology) Lucienne Ripley POUR, MD as Referring Physician (Internal Medicine)  I have updated your Care Teams any recent Medical Services you may have received from other providers in the past year.     Assessment:   This is a routine wellness examination for [employer].  Hearing/Vision screen Hearing Screening - Comments:: Pt denies hearing dif Vision Screening - Comments:: Pt wear glasses/pt goes [facility] [place]/pt last ov 2025   Goals Addressed   None    Depression Screen     07/25/2023    3:15 PM 11/28/2022    2:50 PM 05/17/2022    1:59 PM 07/18/2021    2:04 PM 10/04/2020    2:59 PM 08/12/2020    3:27 PM 05/25/2019    9:22 AM  PHQ 2/9 Scores  PHQ - 2 Score 0  0 0 0 0 0  PHQ- 9 Score     0 0   Exception Documentation  Patient refusal         Fall Risk     07/25/2023    3:09 PM 05/17/2022    1:57 PM 07/18/2021    2:04 PM 10/04/2020    3:00 PM 05/25/2019    9:22 AM  Fall Risk   Falls in the past year? 0 0 0 0 0  Number falls in past yr: 0 0  0   Injury with Fall? 0 0  0    Risk for fall due to : No Fall Risks No Fall Risks  Medication side effect   Follow up Falls evaluation completed Falls prevention discussed  Falls prevention discussed       Data saved with a previous flowsheet row definition    MEDICARE RISK AT HOME:  Medicare Risk at Home Any stairs in or around the home?: No If so, are there any without handrails?: No Home free of loose throw rugs in walkways, pet beds, electrical cords, etc?: Yes Adequate lighting in your home to reduce risk of falls?: Yes Life alert?: No Use of a cane, walker or w/c?: No Grab bars in the bathroom?: Yes Shower chair or bench in shower?: Yes Elevated toilet seat or a handicapped toilet?: Yes  TIMED UP AND GO:  Was the test performed?  no  Cognitive Function: 6CIT completed        07/25/2023    3:12 PM 05/17/2022    2:01 PM 10/04/2020    3:02 PM 09/15/2018    1:29 PM  6CIT Screen  What Year? 0 points 0 points 0 points 0 points  What month? 0 points 0 points 0 points 0 points  What time? 0 points 0 points 0 points 0 points  Count back from 20 0 points 0 points 0 points 0 points  Months in reverse 4 points 0 points 0 points 0 points  Repeat phrase 4 points 0 points 2 points 0 points  Total Score 8 points 0 points 2 points 0 points    Immunizations Immunization History  Administered Date(s) Administered   Janssen (J&J) SARS-COV-2 Vaccination 04/13/2019   MMR 06/18/1996   Moderna Sars-Covid-2 Vaccination 04/13/2019, 12/08/2019, 08/30/2020   Td 03/21/1989, 04/25/1994, 01/31/2005   Tdap 09/16/2018  Zoster Recombinant(Shingrix) 08/12/2020, 01/10/2021    Screening Tests Health Maintenance  Topic Date Due   Hepatitis B Vaccines (1 of 3 - 19+ 3-dose series) Never done   COVID-19 Vaccine (5 - 2024-25 season) 08/09/2024 (Originally 09/02/2022)   INFLUENZA VACCINE  08/02/2023   Fecal DNA (Cologuard)  09/03/2023   Cervical Cancer Screening (HPV/Pap Cotest)  05/24/2024   MAMMOGRAM  07/18/2024   Medicare  Annual Wellness (AWV)  07/24/2024   DTaP/Tdap/Td (5 - Td or Tdap) 09/15/2028   Hepatitis C Screening  Completed   HIV Screening  Completed   Zoster Vaccines- Shingrix  Completed   HPV VACCINES  Aged Out   Meningococcal B Vaccine  Aged Out    Health Maintenance  Health Maintenance Due  Topic Date Due   Hepatitis B Vaccines (1 of 3 - 19+ 3-dose series) Never done   Health Maintenance Items Addressed: See Nurse Notes at the end of this note  Additional Screening:  Vision Screening: Recommended annual ophthalmology exams for early detection of glaucoma and other disorders of the eye. Would you like a referral to an eye doctor? No    Dental Screening: Recommended annual dental exams for proper oral hygiene  Community Resource Referral / Chronic Care Management: CRR required this visit?  No   CCM required this visit?  No   Plan:    I have personally reviewed and noted the following in the patient's chart:   Medical and social history Use of alcohol, tobacco or illicit drugs  Current medications and supplements including opioid prescriptions. Patient is not currently taking opioid prescriptions. Functional ability and status Nutritional status Physical activity Advanced directives List of other physicians Hospitalizations, surgeries, and ER visits in previous 12 months Vitals Screenings to include cognitive, depression, and falls Referrals and appointments  In addition, I have reviewed and discussed with patient certain preventive protocols, quality metrics, and best practice recommendations. A written personalized care plan for preventive services as well as general preventive health recommendations were provided to patient.   Ozie Ned, CMA   07/25/2023   After Visit Summary: (MyChart) Due to this being a telephonic visit, the after visit summary with patients personalized plan was offered to patient via MyChart   Notes: pts 6CIT=8

## 2023-09-09 ENCOUNTER — Encounter: Payer: Self-pay | Admitting: Family

## 2023-09-18 DIAGNOSIS — D429 Neoplasm of uncertain behavior of meninges, unspecified: Secondary | ICD-10-CM | POA: Diagnosis not present

## 2023-09-18 DIAGNOSIS — Z923 Personal history of irradiation: Secondary | ICD-10-CM | POA: Diagnosis not present

## 2023-09-18 DIAGNOSIS — D320 Benign neoplasm of cerebral meninges: Secondary | ICD-10-CM | POA: Diagnosis not present

## 2023-09-18 DIAGNOSIS — Z510 Encounter for antineoplastic radiation therapy: Secondary | ICD-10-CM | POA: Diagnosis not present

## 2023-09-24 ENCOUNTER — Ambulatory Visit: Payer: Self-pay

## 2023-09-24 NOTE — Telephone Encounter (Signed)
 FYI Only or Action Required?: Action required by provider: update on patient condition.  Patient was last seen in primary care on 07/16/2023 by Lavell Bari LABOR, FNP.  Called Nurse Triage reporting Anxiety.  Symptoms began not sure.  Interventions attempted: Nothing.  Symptoms are: gradually worsening.  Triage Disposition: See PCP When Office is Open (Within 3 Days)  Patient/caregiver understands and will follow disposition?: UnsureCopied from CRM #[id number]. Topic: Clinical - Medication Question >> Sep 24, 2023  1:35 PM Avram G wrote: Reason for CRM: patient is requesting for ALPRAZolam  (XANAX ) 0.25 MG tablet she stated she found out she has two more brain tumors. Please advise [phone] (M) Reason for Disposition  Recent traumatic event (e.g., death of a loved one, job loss, victim/witness of crime)  Answer Assessment - Initial Assessment Questions Pt is having gamma knife procedure next Tuesday to treat brain tumors. Pt had an old script of xanax  and needs it refilled. I'm not sure why I told them I stopped taking it. I clearly need it. They are screwing things into my head and I have to be still. I need something to relax me so I don't moved during procedure. Pt is asking if medication can be called in without an office visit at this time. I'm just so overwhelmed with all the appts coming up. No appt made at this time. Please advise         1. CONCERN: Did anything happen that prompted you to call today?      Upcoming procedure 2. ANXIETY SYMPTOMS: Can you describe how you (your loved one; patient) have been feeling? (e.g., tense, restless, panicky, anxious, keyed up, overwhelmed, sense of impending doom).      Overwhelmed  Protocols used: Anxiety and Panic Attack-A-AH

## 2023-09-24 NOTE — Telephone Encounter (Signed)
 Please review

## 2023-09-26 ENCOUNTER — Telehealth (INDEPENDENT_AMBULATORY_CARE_PROVIDER_SITE_OTHER): Admitting: Family

## 2023-09-26 ENCOUNTER — Encounter: Payer: Self-pay | Admitting: Family

## 2023-09-26 DIAGNOSIS — F410 Panic disorder [episodic paroxysmal anxiety] without agoraphobia: Secondary | ICD-10-CM | POA: Diagnosis not present

## 2023-09-26 DIAGNOSIS — D329 Benign neoplasm of meninges, unspecified: Secondary | ICD-10-CM | POA: Diagnosis not present

## 2023-09-26 DIAGNOSIS — F411 Generalized anxiety disorder: Secondary | ICD-10-CM

## 2023-09-26 MED ORDER — ALPRAZOLAM 0.25 MG PO TABS: 0.2500 mg | ORAL_TABLET | Freq: Two times a day (BID) | ORAL | 0 refills | Status: DC | PRN

## 2023-09-26 NOTE — Patient Instructions (Signed)

## 2023-09-26 NOTE — Progress Notes (Signed)
 Virtual Visit Consent   Sheena Willis, you are scheduled for a virtual visit with a [facility] provider today. Just as with appointments in the office, your consent must be obtained to participate. Your consent will be active for this visit and any virtual visit you may have with one of our providers in the next 365 days. If you have a MyChart account, a copy of this consent can be sent to you electronically.  As this is a virtual visit, video technology does not allow for your provider to perform a traditional examination. This may limit your provider's ability to fully assess your condition. If your provider identifies any concerns that need to be evaluated in person or the need to arrange testing (such as labs, EKG, etc.), we will make arrangements to do so. Although advances in technology are sophisticated, we cannot ensure that it will always work on either your end or our end. If the connection with a video visit is poor, the visit may have to be switched to a telephone visit. With either a video or telephone visit, we are not always able to ensure that we have a secure connection.  By engaging in this virtual visit, you consent to the provision of healthcare and authorize for your insurance to be billed (if applicable) for the services provided during this visit. Depending on your insurance coverage, you may receive a charge related to this service.  I need to obtain your verbal consent now. Are you willing to proceed with your visit today? Sheena Willis has provided verbal consent on 09/26/2023 for a virtual visit (video or telephone). Bari Learn, FNP  Date: 09/26/2023 2:55 PM   Virtual Visit via Video Note   I, Bari Learn, connected with  Sheena Willis  ([id number], [birth year]) on 09/26/23 at  2:25 PM EDT by a video-enabled telemedicine application and verified that I am speaking with the correct person using two identifiers.  Location: Patient: Virtual Visit Location  Patient: Home Provider: Virtual Visit Location Provider: Home Office   I discussed the limitations of evaluation and management by telemedicine and the availability of in person appointments. The patient expressed understanding and agreed to proceed.    History of Present Illness: Sheena Willis is a 53 y.o. who identifies as a female who was assigned female at birth, and is being seen today for GAD. She has multiple meningiomas and is scheduled for gamma knife procedure on 10/01/23. She is extremely anxious about this. She is taking celexa  and buspar  with mild relief.   HPI: Anxiety Presents for follow-up visit. Symptoms include excessive worry, hyperventilation, irritability, nervous/anxious behavior, panic and restlessness. Patient reports no suicidal ideas. Symptoms occur constantly. The severity of symptoms is moderate.      Problems:  Patient Active Problem List   Diagnosis Date Noted   Hyperlipidemia 07/09/2022   Marijuana use 05/28/2019   Controlled substance agreement broken 05/25/2019   Late effects of CVA (cerebrovascular accident) 04/30/2019   GAD (generalized anxiety disorder) 04/30/2019   Meningioma (HCC) 08/06/2011    Allergies:  Allergies  Allergen Reactions   Fish Allergy Rash    Scallops only    Medications:  Current Outpatient Medications:    ALPRAZolam  (XANAX ) 0.25 MG tablet, Take 1 tablet (0.25 mg total) by mouth 2 (two) times daily as needed for anxiety., Disp: 30 tablet, Rfl: 0   aspirin EC 81 MG tablet, Take by mouth., Disp: , Rfl:    atorvastatin  (LIPITOR) 40 MG tablet, Take  1 tablet (40 mg total) by mouth daily., Disp: 90 tablet, Rfl: 1   busPIRone  (BUSPAR ) 5 MG tablet, Take 1 tablet (5 mg total) by mouth 3 (three) times daily., Disp: 270 tablet, Rfl: 1   cholecalciferol (VITAMIN D3) 25 MCG (1000 UNIT) tablet, Take 1,000 Units by mouth daily., Disp: , Rfl:    citalopram  (CELEXA ) 40 MG tablet, Take 1 tablet (40 mg total) by mouth daily., Disp: 90 tablet,  Rfl: 1   Multiple Vitamins-Minerals (MULTIVITAMINS THER. W/MINERALS) TABS, Take 1 tablet by mouth daily., Disp: , Rfl:    vitamin C (ASCORBIC ACID) 500 MG tablet, Take 500 mg by mouth daily., Disp: , Rfl:   Observations/Objective: Patient is well-developed, well-nourished in no acute distress.  Resting comfortably  at home.  Head is normocephalic, atraumatic.  No labored breathing.  Speech is clear and coherent with logical content.  Patient is alert and oriented at baseline.  Anxious   Assessment and Plan: 1. GAD (generalized anxiety disorder) (Primary) - ALPRAZolam  (XANAX ) 0.25 MG tablet; Take 1 tablet (0.25 mg total) by mouth 2 (two) times daily as needed for anxiety.  Dispense: 30 tablet; Refill: 0  2. Panic attack - ALPRAZolam  (XANAX ) 0.25 MG tablet; Take 1 tablet (0.25 mg total) by mouth 2 (two) times daily as needed for anxiety.  Dispense: 30 tablet; Refill: 0  3. Meningioma (HCC)  Continue Celexa  and Buspar  Will give xanax  0.25 mg as needed Pt reviewed in [place] controlled database Follow up 3 month  Follow Up Instructions: I discussed the assessment and treatment plan with the patient. The patient was provided an opportunity to ask questions and all were answered. The patient agreed with the plan and demonstrated an understanding of the instructions.  A copy of instructions were sent to the patient via MyChart unless otherwise noted below.   Approx 25 mins spent trying to get patient connected to visit, discussing with her, and charting.   The patient was advised to call back or seek an in-person evaluation if the symptoms worsen or if the condition fails to improve as anticipated.    Bari Learn, FNP

## 2023-09-26 NOTE — Telephone Encounter (Signed)
 I am sorry, but legally I can not prescribe this without a visit. I can do a virtual visit if she still needs this.

## 2023-09-26 NOTE — Telephone Encounter (Signed)
 Appt made.

## 2023-10-01 DIAGNOSIS — D320 Benign neoplasm of cerebral meninges: Secondary | ICD-10-CM | POA: Diagnosis not present

## 2023-10-01 DIAGNOSIS — D429 Neoplasm of uncertain behavior of meninges, unspecified: Secondary | ICD-10-CM | POA: Diagnosis not present

## 2023-10-01 DIAGNOSIS — Z510 Encounter for antineoplastic radiation therapy: Secondary | ICD-10-CM | POA: Diagnosis not present

## 2023-10-11 ENCOUNTER — Encounter: Payer: Self-pay | Admitting: Physician Assistant

## 2023-12-02 NOTE — Progress Notes (Unsigned)
 Sheena Willis [address] [place], [place]  [address] Phone: [phone]   Gastroenterology Consultation  Referring Provider:     Lavell Bari LABOR, Willis Primary Care Physician:  Sheena Willis Primary Gastroenterologist:  Sheena Willis / Sheena Willis  Reason for Consultation:    RLQ pain, dysphagia        HPI:   Discussed the use of AI scribe software for clinical note transcription with the patient, who gave verbal consent to proceed.  53 year old female, new patient, is referred from her PCP to evaluate RLQ abdominal pain, and dysphagia.  No previous GI evaluation or colonoscopy.  She is overdue for first screening colonoscopy due to age.  History of Present Illness [name], describing it as feeling 'like I am so swollen that I'm pregnant.' The discomfort is localized to the right lower quadrant, near the umbilicus, and has been present for about a year. She describes the sensation as a weight pulling when lying on her left side, but not on her right. She has experienced sharp, indescribable pains in the area, which have not been relieved by dietary changes or medications like Tums. No constipation, diarrhea, blood in stool, or unusual weight loss. Regular bowel movements occur daily without straining or hard stools. Previous abdominal ultrasounds and x-rays have not provided a diagnosis.  She has not had an abdominal or pelvic CT.  She reports difficulty swallowing, particularly on the left side of her throat, and sometimes experiences choking on her own saliva. She notes that she can manipulate her throat with her fingers to ease swallowing. No heartburn or acid reflux and no sensation of food or liquids entering the lungs.  She denies food bolus or vomiting episodes.   Her past medical history includes meningiomas, benign brain tumors, and a history of two strokes (in 2019). She was told she had a  hole in her heart following her strokes, which occurred on December 25, 2017. She is on a baby aspirin daily. She has not had any surgical interventions for the heart condition due to the presence of brain tumors.  She has been evaluated by cardiologist and neurologist at [facility].  Family history is significant for her father having had prostate cancer and her mother currently undergoing treatment for lung cancer.  There is no family history of colon cancer.  PMH: History of CVA with right-sided weakness (2019), history of meningiomas, followed by neurology, anxiety, hyperlipidemia.  Former smoker (quit 01/2018).  She has not had any abdominal or pelvic surgeries.  07/2023 labs: Normal CBC, CMP, TSH.  07/2023 abdominal x-ray: Mild gaseous distention of the bowel in the right abdomen.  12/2022 complete abdominal ultrasound: Normal gallbladder.  Possible 5 mm left kidney stone.  No hydronephrosis.  No acute abnormality.  Past Medical History:  Diagnosis Date   Brain tumor (benign) (HCC)    GAD (generalized anxiety disorder) 04/30/2019   Hyperlipidemia 07/09/2022   Hyperlipidemia LDL goal <100    Late effects of CVA (cerebrovascular accident) 04/30/2019   Marijuana use 05/28/2019   Meningiomas, multiple (HCC)    Radiation    for brain tumors   Stroke [facility]) 2019   Vertigo    due to benign brain tumor    Past Surgical History:  Procedure Laterality Date   BRAIN BIOPSY  12/05/2009   HALO APPLICATION     done before gamma knife radio surgery  LESION REMOVAL Left 07/23/2012   Procedure: EXCISION SKIN NEOPLASM LEFT ELBOW;  Surgeon: Oneil DELENA Budge, Willis;  Location: [facility];  Service: General;  Laterality: Left;   MYRINGOTOMY WITH TUBE PLACEMENT Left    Dr. Karis    Prior to Admission medications   Medication Sig Start Date End Date Taking? Authorizing Provider  ALPRAZolam  (XANAX ) 0.25 MG tablet Take 1 tablet (0.25 mg total) by mouth 2 (two) times daily as needed for  anxiety. 09/26/23   Sheena Bari DELENA, Willis  aspirin EC 81 MG tablet Take by mouth. 01/21/18   Provider, Historical, Willis  atorvastatin  (LIPITOR) 40 MG tablet Take 1 tablet (40 mg total) by mouth daily. 07/16/23   Sheena Bari A, Willis  busPIRone  (BUSPAR ) 5 MG tablet Take 1 tablet (5 mg total) by mouth 3 (three) times daily. 07/16/23   Sheena Bari A, Willis  cholecalciferol (VITAMIN D3) 25 MCG (1000 UNIT) tablet Take 1,000 Units by mouth daily.    Provider, Historical, Willis  citalopram  (CELEXA ) 40 MG tablet Take 1 tablet (40 mg total) by mouth daily. 07/16/23   Sheena Bari DELENA, Willis  Multiple Vitamins-Minerals (MULTIVITAMINS THER. W/MINERALS) TABS Take 1 tablet by mouth daily.    Provider, Historical, Willis  vitamin C (ASCORBIC ACID) 500 MG tablet Take 500 mg by mouth daily.    Provider, Historical, Willis    Family History  Problem Relation Age of Onset   Lung cancer Mother    Prostate cancer Father    Cancer Maternal Aunt    Diabetes Maternal Aunt    Cancer Maternal Aunt    Colon cancer Neg Hx      Social History   Tobacco Use   Smoking status: Former    Current packs/day: 0.00    Types: Cigarettes    Quit date: 01/24/2018    Years since quitting: 5.8   Smokeless tobacco: Never  Vaping Use   Vaping status: Never Used  Substance Use Topics   Alcohol use: No    Comment: occ   Drug use: No    Allergies as of 12/03/2023 - Review Complete 12/03/2023  Allergen Reaction Noted   Fish allergy Rash 05/17/2022    Review of Systems:    All systems reviewed and negative except where noted in HPI.   Physical Exam:  BP (!) 90/58   Ht 5' 6 (1.676 m)   Wt 124 lb (56.2 kg)   BMI 20.01 kg/m  No LMP recorded. (Menstrual status: Other).  General:   Alert,  Well-developed, well-nourished, pleasant and cooperative in NAD Mouth: Poor dentition. Neck: Supple with no lymphadenopathy, masses, or thyromegaly. Lungs:  Respirations even and unlabored.  Clear throughout to auscultation.   No wheezes,  crackles, or rhonchi. No acute distress. Heart:  Regular rate and rhythm; no murmurs, clicks, rubs, or gallops. Abdomen:  Normal bowel sounds.  No bruits.  Soft, and non-distended without masses, hepatosplenomegaly or hernias noted.  Mild RLQ tenderness.  Rest of abdomen is not tender.  No palpable masses in the abdomen.  No evidence of hernia.  No guarding or rebound tenderness.    Neurologic:  Alert and oriented x3;  grossly normal neurologically. Psych:  Alert and cooperative. Normal mood and affect. Note: patient smells of cannabis.   Imaging Studies: No results found.  Labs: CBC    Component Value Date/Time   WBC 8.5 07/16/2023 1607   WBC 8.5 01/09/2018 1610   RBC 5.17 07/16/2023 1607   RBC 4.81 01/09/2018 1610  HGB 14.5 07/16/2023 1607   HCT 45.6 07/16/2023 1607   PLT 326 07/16/2023 1607   MCV 88 07/16/2023 1607    CMP     Component Value Date/Time   NA 142 07/16/2023 1607   K 3.9 07/16/2023 1607   CL 102 07/16/2023 1607   CO2 23 07/16/2023 1607   GLUCOSE 63 (L) 07/16/2023 1607   GLUCOSE 81 01/09/2018 1616   BUN 7 07/16/2023 1607   CREATININE 0.92 07/16/2023 1607   CALCIUM  10.4 (H) 07/16/2023 1607   PROT 6.7 07/16/2023 1607   ALBUMIN 4.5 07/16/2023 1607   AST 19 07/16/2023 1607   ALT 20 07/16/2023 1607   ALKPHOS 112 07/16/2023 1607   BILITOT 0.4 07/16/2023 1607   GFRNONAA 62 05/25/2019 1000   GFRAA 72 05/25/2019 1000    Assessment and Plan:   LIDWINA KANER is a 53 y.o. y/o female has been referred for: Assessment & Plan 1.  Right lower quadrant abdominal pain Chronic sharp pain in right lower quadrant for one year. Differential includes appendicitis, diverticulitis, ovarian pathology, nephrolithiasis, constipation, colon spasm. - Ordered CT scan of abdomen and pelvis to evaluate pain and assess for abnormalities. - Scheduled follow-up to discuss CT results and consider further diagnostics such as colonoscopy and upper endoscopy.  2.  Pharyngeal  dysphagia Difficulty swallowing on left side of her throat, sensation of obstruction. Possible esophageal stricture or pathology.  Pharyngeal dysphagia.  Could be related to previous stroke. - Ordered barium swallow test to evaluate swallowing function and assess for esophageal abnormalities. - Scheduled follow-up to discuss barium swallow results and consider further diagnostics such as upper endoscopy.  3.  Colon Caner Screening: She has never had a colonoscopy. - Plan to schedule colonoscopy and at next OV if CT is negative.  4.  History of multiple benign meningiomas, history of CVA in 2019.  Followed by neurology at [facility].  Also saw cardiology at [facility].  Currently takes baby aspirin daily.  Note: Patient does not use MyChart or email.  She prefers telephone call with results.  Follow up office visit with TG after CT and barium swallow test have been completed.  Then we can discuss scheduling colonoscopy and EGD.  Sheena Willis

## 2023-12-03 ENCOUNTER — Ambulatory Visit: Admitting: Physician Assistant

## 2023-12-03 VITALS — BP 90/58 | Ht 66.0 in | Wt 124.0 lb

## 2023-12-03 DIAGNOSIS — R1031 Right lower quadrant pain: Secondary | ICD-10-CM

## 2023-12-03 DIAGNOSIS — R1310 Dysphagia, unspecified: Secondary | ICD-10-CM | POA: Diagnosis not present

## 2023-12-03 DIAGNOSIS — Z1211 Encounter for screening for malignant neoplasm of colon: Secondary | ICD-10-CM

## 2023-12-03 NOTE — Progress Notes (Signed)
 Agree with assessment and plan as outlined.

## 2023-12-03 NOTE — Patient Instructions (Addendum)
 You have been scheduled for a CT scan of the abdomen and pelvis at [facility], 1st floor Radiology. You are scheduled on 12/12/23 at 4:00 pm. You should arrive 15 minutes prior to your appointment time for registration. If you have any questions regarding your exam or if you need to reschedule, you may call [facility] at [phone] between the hours of 8:00 am and 5:00 pm, Monday-Friday.   You have been scheduled for a Barium Esophogram at [facility] (1st floor of the hospital/Entrance A) on 12/23/23 at 1:00 pm. Please arrive 30 minutes prior to your appointment for registration. Make certain not to have anything to eat or drink 3 hours prior to your test. If you need to reschedule for any reason, please contact radiology at [phone] to do so. __________________________________________________________________ A barium swallow is an examination that concentrates on views of the esophagus. This tends to be a double contrast exam (barium and two liquids which, when combined, create a gas to distend the wall of the oesophagus) or single contrast (non-ionic iodine based). The study is usually tailored to your symptoms so a good history is essential. Attention is paid during the study to the form, structure and configuration of the esophagus, looking for functional disorders (such as aspiration, dysphagia, achalasia, motility and reflux) EXAMINATION You may be asked to change into a gown, depending on the type of swallow being performed. A radiologist and radiographer will perform the procedure. The radiologist will advise you of the type of contrast selected for your procedure and direct you during the exam. You will be asked to stand, sit or lie in several different positions and to hold a small amount of fluid in your mouth before being asked to swallow while the imaging is performed .In some instances you may be asked to swallow barium coated marshmallows to assess the  motility of a solid food bolus. The exam can be recorded as a digital or video fluoroscopy procedure. POST PROCEDURE It will take 1-2 days for the barium to pass through your system. To facilitate this, it is important, unless otherwise directed, to increase your fluids for the next 24-48hrs and to resume your normal diet.  This test typically takes about 30 minutes to perform. __________________________________________________________________________________  Please follow up sooner if symptoms increase or worsen  Due to recent changes in healthcare laws, you may see the results of your imaging and laboratory studies on MyChart before your provider has had a chance to review them.  We understand that in some cases there may be results that are confusing or concerning to you. Not all laboratory results come back in the same time frame and the provider may be waiting for multiple results in order to interpret others.  Please give us  48 hours in order for your provider to thoroughly review all the results before contacting the office for clarification of your results.   Thank you for trusting me with your gastrointestinal care!   Ellouise Console, PA-C _______________________________________________________  If your blood pressure at your visit was 140/90 or greater, please contact your primary care physician to follow up on this. _______________________________________________________  If you are age 53 or older, your body mass index should be between 23-30. Your Body mass index is 20.01 kg/m. If this is out of the aforementioned range listed, please consider follow up with your Primary Care Provider.  If you are age 53 or younger, your body mass index should be between 19-25. Your Body mass index  is 20.01 kg/m. If this is out of the aformentioned range listed, please consider follow up with your Primary Care Provider.  ________________________________________________________  The [facility]  providers would like to encourage you to use MYCHART to communicate with providers for non-urgent requests or questions.  Due to long hold times on the telephone, sending your provider a message by [facility] may be a faster and more efficient way to get a response.  Please allow 48 business hours for a response.  Please remember that this is for non-urgent requests.  _______________________________________________________

## 2023-12-09 ENCOUNTER — Telehealth: Payer: Self-pay | Admitting: Physician Assistant

## 2023-12-09 NOTE — Telephone Encounter (Signed)
 Pt called and wanted to know if she was going to be sedated fir any of her upcoming procedures. Discussed with pt that she will note be sedated for radiology tests.

## 2023-12-09 NOTE — Telephone Encounter (Signed)
 Patient is requesting a call to discuss 12/22 appointment. Please advise, thank you

## 2023-12-09 NOTE — Telephone Encounter (Signed)
 Sheena Willis

## 2023-12-11 ENCOUNTER — Encounter (HOSPITAL_BASED_OUTPATIENT_CLINIC_OR_DEPARTMENT_OTHER): Payer: Self-pay

## 2023-12-12 ENCOUNTER — Ambulatory Visit (HOSPITAL_BASED_OUTPATIENT_CLINIC_OR_DEPARTMENT_OTHER)
Admission: RE | Admit: 2023-12-12 | Discharge: 2023-12-12 | Attending: Physician Assistant | Admitting: Physician Assistant

## 2023-12-12 DIAGNOSIS — R1031 Right lower quadrant pain: Secondary | ICD-10-CM | POA: Insufficient documentation

## 2023-12-12 MED ORDER — IOHEXOL 300 MG/ML  SOLN
100.0000 mL | Freq: Once | INTRAMUSCULAR | Status: AC | PRN
  Administered 2023-12-12: 100 mL via INTRAVENOUS

## 2023-12-19 ENCOUNTER — Other Ambulatory Visit (HOSPITAL_COMMUNITY)

## 2023-12-19 ENCOUNTER — Ambulatory Visit: Payer: Self-pay | Admitting: Physician Assistant

## 2023-12-19 NOTE — Progress Notes (Signed)
 Please call and notify patient abdominal pelvic CT shows: 1.  No acute abnormality to explain abdominal pain. 2.  No evidence of appendicitis or diverticulitis. 3.  No abdominal masses or enlarged lymph nodes. 4.  Incidental degenerative disc disease in the low back. I recommend keep follow-up appointment 01/20/2024 to discuss scheduling colonoscopy. Ellouise Console, PA-C

## 2023-12-23 ENCOUNTER — Ambulatory Visit (HOSPITAL_COMMUNITY)
Admission: RE | Admit: 2023-12-23 | Discharge: 2023-12-23 | Disposition: A | Discharge status: Home / Self Care | Source: Ambulatory Visit | Attending: Physician Assistant | Admitting: Physician Assistant

## 2023-12-23 DIAGNOSIS — R1310 Dysphagia, unspecified: Secondary | ICD-10-CM | POA: Diagnosis present

## 2023-12-29 NOTE — Progress Notes (Signed)
 Please call and Notify patient barium swallow test shows:   Normal swallowing.  No aspiration.  Normal esophageal motility.  No hiatal hernia or GERD.  Barium tablet was not able to be given.  No evidence of esophageal masses. There were some osteophytes on the cervical vertebrae mildly pressing on the posterior esophagus, which could contribute to difficulty swallowing. .The exam was limited exam due to patient's inability to drink the thick barium.  She had projectile vomiting after the first sip of barium.  I recommend keep follow-up appointment 01/20/2024 to discuss scheduling EGD and colonoscopy procedures.  Ellouise Console, PA-C

## 2024-01-10 ENCOUNTER — Other Ambulatory Visit: Payer: Self-pay | Admitting: Family

## 2024-01-16 ENCOUNTER — Ambulatory Visit (INDEPENDENT_AMBULATORY_CARE_PROVIDER_SITE_OTHER): Payer: Self-pay | Admitting: Family

## 2024-01-16 ENCOUNTER — Encounter: Payer: Self-pay | Admitting: Family

## 2024-01-16 VITALS — BP 105/69 | HR 66 | Temp 98.3°F | Ht 66.0 in | Wt 125.0 lb

## 2024-01-16 DIAGNOSIS — D329 Benign neoplasm of meninges, unspecified: Secondary | ICD-10-CM

## 2024-01-16 DIAGNOSIS — F1290 Cannabis use, unspecified, uncomplicated: Secondary | ICD-10-CM

## 2024-01-16 DIAGNOSIS — F411 Generalized anxiety disorder: Secondary | ICD-10-CM

## 2024-01-16 DIAGNOSIS — I6930 Unspecified sequelae of cerebral infarction: Secondary | ICD-10-CM | POA: Diagnosis not present

## 2024-01-16 DIAGNOSIS — F410 Panic disorder [episodic paroxysmal anxiety] without agoraphobia: Secondary | ICD-10-CM

## 2024-01-16 DIAGNOSIS — E782 Mixed hyperlipidemia: Secondary | ICD-10-CM | POA: Diagnosis not present

## 2024-01-16 MED ORDER — ATORVASTATIN CALCIUM 40 MG PO TABS: 40.0000 mg | ORAL_TABLET | Freq: Every day | ORAL | 1 refills | Status: AC

## 2024-01-16 MED ORDER — BUSPIRONE HCL 5 MG PO TABS: 5.0000 mg | ORAL_TABLET | Freq: Three times a day (TID) | ORAL | 1 refills | Status: AC

## 2024-01-16 MED ORDER — CITALOPRAM HYDROBROMIDE 40 MG PO TABS: 40.0000 mg | ORAL_TABLET | Freq: Every day | ORAL | 1 refills | Status: AC

## 2024-01-16 NOTE — Progress Notes (Signed)
 "  Subjective:    Patient ID: Sheena Willis, female    DOB: 01/25/1970, 54 y.o.   MRN: [id number]  Chief Complaint  Patient presents with   Medical Management of Chronic Issues   Pt presents to the office today for chronic follow up.   She is followed by Neurologists annually for Hx CVA with right sided weakness and hx meningioma.     Pt has seen GI since our last visit for chronic abdominal bloating, pain, and dysphagia for 6+ years.  Anxiety Presents for follow-up visit. Symptoms include excessive worry, irritability, nervous/anxious behavior and restlessness. Symptoms occur occasionally. The severity of symptoms is moderate.    Hyperlipidemia This is a chronic problem. The current episode started more than 1 year ago. The problem is controlled. Recent lipid tests were reviewed and are normal. Current antihyperlipidemic treatment includes statins. The current treatment provides moderate improvement of lipids. Risk factors for coronary artery disease include dyslipidemia, hypertension and a sedentary lifestyle.      Review of Systems  Constitutional:  Positive for irritability.  Psychiatric/Behavioral:  The patient is nervous/anxious.   All other systems reviewed and are negative.  Family History  Problem Relation Age of Onset   Lung cancer Mother    Prostate cancer Father    Cancer Maternal Aunt    Diabetes Maternal Aunt    Cancer Maternal Aunt    Colon cancer Neg Hx    Social History   Socioeconomic History   Marital status: Divorced    Spouse name: Not on file   Number of children: 2   Years of education: 12   Highest education level: High school graduate  Occupational History   Occupation: disability  Tobacco Use   Smoking status: Former    Current packs/day: 0.00    Average packs/day: 0.3 packs/day    Types: Cigarettes    Quit date: 01/24/2018    Years since quitting: 5.9   Smokeless tobacco: Never  Vaping Use   Vaping status: Never Used  Substance and  Sexual Activity   Alcohol use: No    Comment: occ   Drug use: No   Sexual activity: Not Currently  Other Topics Concern   Not on file  Social History Narrative   Not on file   Social Drivers of Health   Tobacco Use: Medium Risk (01/16/2024)   Patient History    Smoking Tobacco Use: Former    Smokeless Tobacco Use: Never    Passive Exposure: Not on Actuary Strain: Low Risk (07/25/2023)   Overall Financial Resource Strain (CARDIA)    Difficulty of Paying Living Expenses: Not hard at all  Food Insecurity: No Food Insecurity (07/25/2023)   Epic    Worried About Radiation Protection Practitioner of Food in the Last Year: Never true    Ran Out of Food in the Last Year: Never true  Transportation Needs: No Transportation Needs (07/25/2023)   Epic    Lack of Transportation (Medical): No    Lack of Transportation (Non-Medical): No  Physical Activity: Inactive (07/25/2023)   Exercise Vital Sign    Days of Exercise per Week: 0 days    Minutes of Exercise per Session: 0 min  Stress: No Stress Concern Present (07/25/2023)   [employer] of Occupational Health - Occupational Stress Questionnaire    Feeling of Stress: Not at all  Social Connections: Moderately Isolated (07/25/2023)   Social Connection and Isolation Panel    Frequency of Communication with Friends and Family:  More than three times a week    Frequency of Social Gatherings with Friends and Family: More than three times a week    Attends Religious Services: More than 4 times per year    Active Member of Clubs or Organizations: No    Attends Banker Meetings: Never    Marital Status: Divorced  Depression (PHQ2-9): Low Risk (01/16/2024)   Depression (PHQ2-9)    PHQ-2 Score: 2  Alcohol Screen: Low Risk (07/25/2023)   Alcohol Screen    Last Alcohol Screening Score (AUDIT): 0  Housing: Unknown (07/25/2023)   Epic    Unable to Pay for Housing in the Last Year: No    Number of Times Moved in the Last Year: Not on file     Homeless in the Last Year: No  Utilities: Not At Risk (07/25/2023)   Epic    Threatened with loss of utilities: No  Health Literacy: Adequate Health Literacy (07/25/2023)   B1300 Health Literacy    Frequency of need for help with medical instructions: Never       Objective:   Physical Exam Vitals reviewed.  Constitutional:      General: She is not in acute distress.    Appearance: She is well-developed.  HENT:     Head: Normocephalic and atraumatic.     Right Ear: Tympanic membrane normal.     Left Ear: Tympanic membrane normal.  Eyes:     Pupils: Pupils are equal, round, and reactive to light.  Neck:     Thyroid : No thyromegaly.  Cardiovascular:     Rate and Rhythm: Normal rate and regular rhythm.     Heart sounds: Normal heart sounds. No murmur heard. Pulmonary:     Effort: Pulmonary effort is normal. No respiratory distress.     Breath sounds: Normal breath sounds. No wheezing.  Abdominal:     General: Bowel sounds are normal. There is no distension.     Palpations: Abdomen is soft.     Tenderness: There is no abdominal tenderness.  Musculoskeletal:        General: No tenderness. Normal range of motion.     Cervical back: Normal range of motion and neck supple.  Skin:    General: Skin is warm and dry.  Neurological:     Mental Status: She is alert and oriented to person, place, and time.     Cranial Nerves: No cranial nerve deficit.     Deep Tendon Reflexes: Reflexes are normal and symmetric.  Psychiatric:        Behavior: Behavior normal.        Thought Content: Thought content normal.        Judgment: Judgment normal.        BP 105/69   Pulse 66   Temp 98.3 F (36.8 C) (Temporal)   Ht 5' 6 (1.676 m)   Wt 125 lb (56.7 kg)   SpO2 97%   BMI 20.18 kg/m   Assessment & Plan:   Sheena Willis comes in today with chief complaint of Medical Management of Chronic Issues   Diagnosis and orders addressed: 1. Meningioma (HCC) (Primary) - CMP14+EGFR - CBC  with Differential/Platelet  2. Marijuana use - CMP14+EGFR - CBC with Differential/Platelet  3. Late effects of CVA (cerebrovascular accident) - atorvastatin  (LIPITOR) 40 MG tablet; Take 1 tablet (40 mg total) by mouth daily.  Dispense: 90 tablet; Refill: 1 - CMP14+EGFR - CBC with Differential/Platelet - TSH  4. Moderate mixed hyperlipidemia not  requiring statin therapy - atorvastatin  (LIPITOR) 40 MG tablet; Take 1 tablet (40 mg total) by mouth daily.  Dispense: 90 tablet; Refill: 1 - CMP14+EGFR - CBC with Differential/Platelet - TSH  5. GAD (generalized anxiety disorder) - busPIRone  (BUSPAR ) 5 MG tablet; Take 1 tablet (5 mg total) by mouth 3 (three) times daily.  Dispense: 270 tablet; Refill: 1 - citalopram  (CELEXA ) 40 MG tablet; Take 1 tablet (40 mg total) by mouth daily.  Dispense: 90 tablet; Refill: 1 - CMP14+EGFR - CBC with Differential/Platelet - TSH  6. Panic attack - busPIRone  (BUSPAR ) 5 MG tablet; Take 1 tablet (5 mg total) by mouth 3 (three) times daily.  Dispense: 270 tablet; Refill: 1 - CMP14+EGFR - CBC with Differential/Platelet    Labs pending Keep follow up with GI and Neurologists  Continue current medications  Health Maintenance reviewed Diet and exercise encouraged  Follow up plan: 6 months    Bari Learn, FNP  "

## 2024-01-16 NOTE — Patient Instructions (Signed)

## 2024-01-17 ENCOUNTER — Ambulatory Visit: Payer: Self-pay | Admitting: Family

## 2024-01-17 DIAGNOSIS — R7989 Other specified abnormal findings of blood chemistry: Secondary | ICD-10-CM

## 2024-01-17 LAB — CMP14+EGFR
ALT: 17 IU/L (ref 0–32)
AST: 14 IU/L (ref 0–40)
Albumin: 4.3 g/dL (ref 3.8–4.9)
Alkaline Phosphatase: 92 IU/L (ref 49–135)
BUN/Creatinine Ratio: 16 (ref 9–23)
BUN: 14 mg/dL (ref 6–24)
Bilirubin Total: 0.3 mg/dL (ref 0.0–1.2)
CO2: 26 mmol/L (ref 20–29)
Calcium: 9.3 mg/dL (ref 8.7–10.2)
Chloride: 101 mmol/L (ref 96–106)
Creatinine, Ser: 0.85 mg/dL (ref 0.57–1.00)
Globulin, Total: 2.1 g/dL (ref 1.5–4.5)
Glucose: 83 mg/dL (ref 70–99)
Potassium: 4 mmol/L (ref 3.5–5.2)
Sodium: 139 mmol/L (ref 134–144)
Total Protein: 6.4 g/dL (ref 6.0–8.5)
eGFR: 82 mL/min/1.73

## 2024-01-17 LAB — CBC WITH DIFFERENTIAL/PLATELET
Basophils Absolute: 0.1 x10E3/uL (ref 0.0–0.2)
Basos: 1 %
EOS (ABSOLUTE): 0.4 x10E3/uL (ref 0.0–0.4)
Eos: 5 %
Hematocrit: 41.3 % (ref 34.0–46.6)
Hemoglobin: 12.7 g/dL (ref 11.1–15.9)
Immature Grans (Abs): 0 x10E3/uL (ref 0.0–0.1)
Immature Granulocytes: 0 %
Lymphocytes Absolute: 2.2 x10E3/uL (ref 0.7–3.1)
Lymphs: 27 %
MCH: 28 pg (ref 26.6–33.0)
MCHC: 30.8 g/dL — ABNORMAL LOW (ref 31.5–35.7)
MCV: 91 fL (ref 79–97)
Monocytes Absolute: 0.4 x10E3/uL (ref 0.1–0.9)
Monocytes: 5 %
Neutrophils Absolute: 5.3 x10E3/uL (ref 1.4–7.0)
Neutrophils: 62 %
Platelets: 263 x10E3/uL (ref 150–450)
RBC: 4.54 x10E6/uL (ref 3.77–5.28)
RDW: 13.4 % (ref 11.7–15.4)
WBC: 8.3 x10E3/uL (ref 3.4–10.8)

## 2024-01-17 LAB — TSH: TSH: 0.255 u[IU]/mL — AB (ref 0.450–4.500)

## 2024-01-19 NOTE — Progress Notes (Unsigned)
 "     Sheena Console, PA-C [address] [place], [place]  [address] Phone: [phone]   Primary Care Physician: Lavell Bari LABOR, FNP  Primary Gastroenterologist:  Sheena Console, PA-C / Elspeth Naval, MD   Chief Complaint: Follow-up dysphagia and abdominal pain; Discuss scheduling EGD and colonoscopy   HPI:   Discussed the use of AI scribe software for clinical note transcription with the patient, who gave verbal consent to proceed.  I last saw patient in 12/03/2023 to evaluate dysphagia and RLQ pain.  12/12/2023 abdominal pelvic CT with contrast:  No evidence of appendicitis or significant abnormality.  L5-S1 degenerative disc disease incidentally noted.  12/23/2023 barium swallow tablet:  Limited study due to patient's intolerance of thick barium. She projectile vomited thick barium after the first sip and she was unable to drink any more.  Barium tablet was not given.  \Images obtained with thin barium showed cervical osteophytes with mild posterior esophageal compression.  Otherwise normal barium study from the images obtained.  She has never had a colonoscopy.  She is currently due for first screening colonoscopy due to age.   History of Present Illness   PMH: History of CVA with right-sided weakness (2019), history of meningiomas, followed by neurology at [facility], anxiety, hyperlipidemia.  Former smoker (quit 01/2018).   She has not had any abdominal or pelvic surgeries.   07/2023 labs: Normal CBC, CMP, TSH.   07/2023 abdominal x-ray: Mild gaseous distention of the bowel in the right abdomen.   12/2022 complete abdominal ultrasound: Normal gallbladder.  Possible 5 mm left kidney stone.  No hydronephrosis.  No acute abnormality.   Current Outpatient Medications  Medication Sig Dispense Refill   aspirin EC 81 MG tablet Take by mouth.     atorvastatin  (LIPITOR) 40 MG tablet Take 1 tablet (40 mg total) by mouth daily. 90 tablet 1   busPIRone  (BUSPAR ) 5 MG tablet  Take 1 tablet (5 mg total) by mouth 3 (three) times daily. 270 tablet 1   cholecalciferol (VITAMIN D3) 25 MCG (1000 UNIT) tablet Take 1,000 Units by mouth daily.     citalopram  (CELEXA ) 40 MG tablet Take 1 tablet (40 mg total) by mouth daily. 90 tablet 1   Multiple Vitamins-Minerals (MULTIVITAMINS THER. W/MINERALS) TABS Take 1 tablet by mouth daily.     vitamin C (ASCORBIC ACID) 500 MG tablet Take 500 mg by mouth daily.     No current facility-administered medications for this visit.    Allergies as of 01/20/2024 - Review Complete 01/16/2024  Allergen Reaction Noted   Fish allergy Rash 05/17/2022    Past Medical History:  Diagnosis Date   Brain tumor (benign) (HCC)    GAD (generalized anxiety disorder) 04/30/2019   Hyperlipidemia 07/09/2022   Hyperlipidemia LDL goal <100    Late effects of CVA (cerebrovascular accident) 04/30/2019   Marijuana use 05/28/2019   Meningiomas, multiple (HCC)    Radiation    for brain tumors   Stroke [facility]) 2019   Vertigo    due to benign brain tumor    Past Surgical History:  Procedure Laterality Date   BRAIN BIOPSY  12/05/2009   HALO APPLICATION     done before gamma knife radio surgery   LESION REMOVAL Left 07/23/2012   Procedure: EXCISION SKIN NEOPLASM LEFT ELBOW;  Surgeon: Oneil LABOR Budge, MD;  Location: [facility];  Service: General;  Laterality: Left;   MYRINGOTOMY WITH TUBE PLACEMENT Left    Dr. Karis    Review  of Systems:    All systems reviewed and negative except where noted in HPI.    Physical Exam:  There were no vitals taken for this visit. No LMP recorded. (Menstrual status: Other).  General: Well-nourished, well-developed in no acute distress.  Lungs: Clear to auscultation bilaterally. Non-labored. Heart: Regular rate and rhythm, no murmurs rubs or gallops.  Abdomen: Bowel sounds are normal; Abdomen is Soft; No hepatosplenomegaly, masses or hernias;  No Abdominal Tenderness; No guarding or rebound tenderness. Neuro: Alert and  oriented x 3.  Grossly intact.  Psych: Alert and cooperative, normal mood and affect.   Imaging Studies: DG ESOPHAGUS W SINGLE CM (SOL OR THIN BA) Result Date: 12/23/2023 CLINICAL DATA:  Patient with a history of dysphagia and complaints of left neck pain. EXAM: ESOPHAGUS/BARIUM SWALLOW/TABLET STUDY TECHNIQUE: Combined double and single contrast examination was performed using effervescent crystals, high-density barium, and thin liquid barium. This exam was performed by Warren Dais, NP, and was supervised and interpreted by Dr. Jenna. FLUOROSCOPY: Radiation Exposure Index (as provided by the fluoroscopic device): 4.8 mGy Kerma COMPARISON:  DG Esophagus 05/29/17 FINDINGS: Swallowing: Appears normal. No vestibular penetration or aspiration seen. Pharynx: Unremarkable. Esophagus: Cervical osteophytes with mild posterior esophageal compression. Esophageal motility: Within normal limits. Hiatal Hernia: None. Gastroesophageal reflux: None visualized/not tested. Ingested 13mm barium tablet: Not given Other: None. IMPRESSION: Limited study due to patient's intolerance of thick barium. She projectile vomited thick barium after the first sip and she was unable to drink any more. Images obtained with thin barium showed cervical osteophytes with mild posterior esophageal compression. Otherwise normal barium study from the images obtained. Electronically Signed   By: Cordella Jenna   On: 12/23/2023 15:27    Labs: CBC    Component Value Date/Time   WBC 8.3 01/16/2024 1557   WBC 8.5 01/09/2018 1610   RBC 4.54 01/16/2024 1557   RBC 4.81 01/09/2018 1610   HGB 12.7 01/16/2024 1557   HCT 41.3 01/16/2024 1557   PLT 263 01/16/2024 1557   MCV 91 01/16/2024 1557   MCH 28.0 01/16/2024 1557   MCH 28.1 01/09/2018 1610   MCHC 30.8 (L) 01/16/2024 1557   MCHC 32.0 01/09/2018 1610   RDW 13.4 01/16/2024 1557   LYMPHSABS 2.2 01/16/2024 1557   MONOABS 0.6 01/09/2018 1610   EOSABS 0.4 01/16/2024 1557   BASOSABS  0.1 01/16/2024 1557    CMP     Component Value Date/Time   NA 139 01/16/2024 1557   K 4.0 01/16/2024 1557   CL 101 01/16/2024 1557   CO2 26 01/16/2024 1557   GLUCOSE 83 01/16/2024 1557   GLUCOSE 81 01/09/2018 1616   BUN 14 01/16/2024 1557   CREATININE 0.85 01/16/2024 1557   CALCIUM  9.3 01/16/2024 1557   PROT 6.4 01/16/2024 1557   ALBUMIN 4.3 01/16/2024 1557   AST 14 01/16/2024 1557   ALT 17 01/16/2024 1557   ALKPHOS 92 01/16/2024 1557   BILITOT 0.3 01/16/2024 1557   GFRNONAA 62 05/25/2019 1000   GFRAA 72 05/25/2019 1000       Assessment and Plan:   JERRILYNN MIKOWSKI is a 54 y.o. y/o female   1.  Dysphagia - Scheduling EGD I discussed risks of EGD with patient to include risk of bleeding, perforation, and risk of sedation.  Patient expressed understanding and agrees to proceed with EGD.   2.  Colon cancer screening - Scheduling Colonoscopy I discussed risks of colonoscopy with patient to include risk of bleeding, colon perforation, and risk  of sedation.  Patient expressed understanding and agrees to proceed with colonoscopy.    Assessment & Plan       Sheena Console, PA-C  Follow up ***   "

## 2024-01-20 ENCOUNTER — Ambulatory Visit: Admitting: Physician Assistant

## 2024-02-18 ENCOUNTER — Ambulatory Visit: Admitting: Physician Assistant

## 2024-07-16 ENCOUNTER — Ambulatory Visit: Admitting: Family

## 2024-07-27 ENCOUNTER — Ambulatory Visit

## 2024-07-28 ENCOUNTER — Ambulatory Visit: Payer: Self-pay

## 2024-07-28 ENCOUNTER — Ambulatory Visit
